# Patient Record
Sex: Female | Born: 1975
Health system: Southern US, Community
[De-identification: ages and names within clinical notes are randomized; demographics above are authoritative.]

## PROBLEM LIST (undated history)

## (undated) HISTORY — PX: FOOT SURGERY: SHX648

## (undated) HISTORY — PX: TUBAL LIGATION: SHX77

---

## 2003-09-23 ENCOUNTER — Emergency Department (HOSPITAL_COMMUNITY): Admission: EM | Admit: 2003-09-23 | Discharge: 2003-09-23 | Payer: Self-pay | Admitting: Emergency Medicine

## 2005-05-17 ENCOUNTER — Other Ambulatory Visit: Admission: RE | Admit: 2005-05-17 | Discharge: 2005-05-17 | Payer: Self-pay | Admitting: Obstetrics and Gynecology

## 2005-06-04 ENCOUNTER — Inpatient Hospital Stay (HOSPITAL_COMMUNITY): Admission: AD | Admit: 2005-06-04 | Discharge: 2005-06-04 | Payer: Self-pay | Admitting: Obstetrics and Gynecology

## 2006-05-21 ENCOUNTER — Emergency Department (HOSPITAL_COMMUNITY): Admission: EM | Admit: 2006-05-21 | Discharge: 2006-05-21 | Payer: Self-pay | Admitting: Emergency Medicine

## 2006-05-24 ENCOUNTER — Ambulatory Visit (HOSPITAL_COMMUNITY): Admission: RE | Admit: 2006-05-24 | Discharge: 2006-05-24 | Payer: Self-pay | Admitting: Obstetrics & Gynecology

## 2006-05-30 ENCOUNTER — Ambulatory Visit (HOSPITAL_COMMUNITY): Admission: RE | Admit: 2006-05-30 | Discharge: 2006-05-30 | Payer: Self-pay | Admitting: Obstetrics & Gynecology

## 2006-06-02 ENCOUNTER — Ambulatory Visit (HOSPITAL_COMMUNITY): Admission: RE | Admit: 2006-06-02 | Discharge: 2006-06-02 | Payer: Self-pay | Admitting: Obstetrics & Gynecology

## 2006-06-02 ENCOUNTER — Encounter (INDEPENDENT_AMBULATORY_CARE_PROVIDER_SITE_OTHER): Payer: Self-pay | Admitting: Specialist

## 2008-01-08 ENCOUNTER — Ambulatory Visit (HOSPITAL_BASED_OUTPATIENT_CLINIC_OR_DEPARTMENT_OTHER): Admission: RE | Admit: 2008-01-08 | Discharge: 2008-01-08 | Payer: Self-pay | Admitting: Obstetrics and Gynecology

## 2008-03-02 ENCOUNTER — Emergency Department (HOSPITAL_COMMUNITY): Admission: EM | Admit: 2008-03-02 | Discharge: 2008-03-02 | Payer: Self-pay | Admitting: Emergency Medicine

## 2008-11-04 ENCOUNTER — Emergency Department (HOSPITAL_COMMUNITY): Admission: EM | Admit: 2008-11-04 | Discharge: 2008-11-04 | Payer: Self-pay | Admitting: Emergency Medicine

## 2009-07-10 ENCOUNTER — Emergency Department (HOSPITAL_COMMUNITY): Admission: EM | Admit: 2009-07-10 | Discharge: 2009-07-10 | Payer: Self-pay | Admitting: Family Medicine

## 2009-07-25 ENCOUNTER — Emergency Department (HOSPITAL_COMMUNITY): Admission: EM | Admit: 2009-07-25 | Discharge: 2009-07-25 | Payer: Self-pay | Admitting: Family Medicine

## 2010-05-05 LAB — BASIC METABOLIC PANEL
CO2: 24 mEq/L (ref 19–32)
GFR calc Af Amer: >60 mL/min (ref >60)
GFR calc non Af Amer: >60 mL/min (ref >60)

## 2010-05-05 LAB — D-DIMER, QUANTITATIVE: D-Dimer, Quant: <0.22 ug/mL-FEU (ref 0.00–0.48)

## 2010-05-05 LAB — CBC
HCT: 37.6 % (ref 36.0–46.0)
RBC: 4.99 MIL/uL (ref 3.87–5.11)

## 2010-05-05 LAB — CK TOTAL AND CKMB (NOT AT ARMC)
CK, MB: 0.7 ng/mL (ref 0.3–4.0)
Total CK: 96 U/L (ref 7–177)

## 2010-06-02 NOTE — Op Note (Signed)
NAME:  Kimberly Kerr, Kimberly Kerr NO.:  [id number]   MEDICAL RECORD NO.:  [id number]          PATIENT TYPE:  AMB   LOCATION:  NESC                         FACILITY:  [facility]   PHYSICIAN:  Pieter Partridge, MD   DATE OF BIRTH:  06-13-1975   DATE OF PROCEDURE:  01/08/2008  DATE OF DISCHARGE:                               OPERATIVE REPORT   PREOPERATIVE DIAGNOSIS:  Undesired fertility.   POSTOPERATIVE DIAGNOSIS:  Undesired fertility.   PROCEDURE:  Laparoscopic bilateral tubal ligation.   SURGEON:  Geryl Rankins, M.D.   ASSISTANT:  Surgical technician.   ANESTHESIA:  General.   FINDINGS:  Retroverted uterus, normal tubes and ovaries bilaterally.   SPECIMENS:  None.   ESTIMATED BLOOD LOSS:  Minimal.   URINE OUTPUT:  Voided prior to procedure.   INTRAVENOUS FLUIDS:  1 liter.   COMPLICATIONS:  None.   DISPOSITION:  To PACU to stable.   PROCEDURE IN DETAIL:  [name] was identified in the holding area. She  was then taken to the operating room with IV fluids running. She voided  prior to procedure. She was placed in the supine position and underwent  general endotracheal anesthesia without complication.   The patient was then prepped and draped in a normal sterile fashion and  placed in the dorsal lithotomy position. An operative speculum was  placed in the vagina. Prior to prepping, I performed a bimanual exam and  confirmed the retroverted uterus. The single-tooth tenaculum was placed  on the posterior lip of the cervix, and the Hulka manipulator was  advanced through the uterine cervix pointed posteriorly, and the single-  tooth tenaculum was removed.   Attention was turned to the abdomen. A small infra-umbilical incision  vertically was made at the midline with a scalpel. The 5-mm trocar was  advanced easily through this port site. The patient was then placed in  Trendelenburg, and a second port in the suprapubic midline was placed 5  mm under direct  visualization with a laparoscopic.   A blunt probe manipulator was then used to explore the uterus and the  tubes, ovaries, and cul-de-sac of which there were no obvious  abnormalities. The Enseal device was then advanced through the  suprapubic port, and the fallopian tubes were coagulated and then  transected in the usual fashion. The same was done on the opposite side.   The instrument was then removed, and trocar was removed under direct  visualization. The CO2 gas was turned off, and all of the gas escaped  the abdomen via compression.   The incisions were then reapproximated with 4-0 Monocryl in the  subcutaneous space, and Dermabond was applied to the incision after they  were cleaned and dried.   The Hulka manipulator was then removed from the uterus, and there was  active bleeding noted with pressure. With silver nitrate and Monsel's,  the site was hemostatic.   All instrument, sponge, and needle counts were correct x3. The patient  was taken to the PACU in stable condition.      Pieter Partridge, MD  Electronically Signed  EBV/MEDQ  D:  01/08/2008  T:  01/08/2008  Job:  [id number]

## 2010-06-02 NOTE — H&P (Signed)
NAME:  Kimberly Kerr, Kimberly Kerr NO.:  [id number]   MEDICAL RECORD NO.:  [id number]          PATIENT TYPE:  AMB   LOCATION:  SDC                           FACILITY:  [facility]   PHYSICIAN:  Roseanna Rainbow, M.D.DATE OF BIRTH:  [birth year]   DATE OF ADMISSION:  DATE OF DISCHARGE:                              HISTORY & PHYSICAL   CHIEF COMPLAINT:  Patient is a 35 year old with an embryonic demise who  presents for suction D&C.   HISTORY OF PRESENT ILLNESS:  The patient has had serial ultrasounds to  document viability.  The most recent ultrasound failed to demonstrate  any interval growth of the gestational sac from the previous study.  No  fetal pole was noted.  These findings were felt to be consistent with an  anembryonic pregnancy.   PAST GYN HISTORY:  Noncontributory.   PAST MEDICAL HISTORY:  No significant history of medical diseases.   PAST SURGICAL HISTORY:  She denies.   SOCIAL HISTORY:  She is employed as a Diplomatic Services operational officer.  She is married, living  with her spouse.  Does not give any significant history of alcohol  usage.  Has no significant smoking history.  Denies illicit drug use.   FAMILY HISTORY:  Adult-onset diabetes and hypertension.   ALLERGIES:  No known drug allergies.   PAST OB HISTORY:  She has had three spontaneous vaginal deliveries at  term, uncomplicated.   PHYSICAL EXAMINATION:  VITAL SIGNS:  Blood pressure 122/82.  GENERAL:  Well-developed and well-nourished in no apparent distress.  LUNGS:  Clear to auscultation bilaterally.  HEART:  Regular rate and rhythm.  ABDOMEN:  Soft and nontender.  PELVIC:  Normal external female genitalia.  On speculum exam, the vagina  is clean.  The cervix is closed and without lesions.  On bimanual exam,  the uterus is upper limits of normal in size, nontender.  EXTREMITIES:  No clubbing, cyanosis or edema.   ASSESSMENT:  Embryonic demise.   PLAN:  The planned procedure is a suction dilatation and curettage.   The  risks, benefits and alternative forms of management were reviewed with  the patient, and informed consent had been obtained.  Will check an ABO  and Rh and administer RhoGAM as needed.      Roseanna Rainbow, M.D.  Electronically Signed     LAJ/MEDQ  D:  06/01/2006  T:  06/01/2006  Job:  [id number]

## 2010-06-02 NOTE — Op Note (Signed)
NAME:  Kimberly Kerr, Kimberly Kerr NO.:  [id number]   MEDICAL RECORD NO.:  [id number]          PATIENT TYPE:  AMB   LOCATION:  SDC                           FACILITY:  [facility]   PHYSICIAN:  Roseanna Rainbow, M.D.DATE OF BIRTH:  1975/04/20   DATE OF PROCEDURE:  06/02/2006  DATE OF DISCHARGE:                               OPERATIVE REPORT   PREOPERATIVE DIAGNOSIS:  Embryonic demise.   POSTOPERATIVE DIAGNOSIS:  Embryonic demise.   PROCEDURE:  Suction dilatation and curettage.   SURGEON:  Dr. Tamela Oddi   ANESTHESIA:  Managed anesthesia care, paracervical block.   PATHOLOGY:  Products of conception.   ESTIMATED BLOOD LOSS:  Minimal.   COMPLICATIONS:  None.   PROCEDURE:  The patient is taken to the operating room with an IV  running.  The patient placed in the dorsal lithotomy position and  prepped and draped in the usual sterile fashion.  A Graves speculum was  then placed into the vagina.  The anterior lip of the cervix was then  infiltrated with 2 mL of 1% lidocaine.  The single-tooth tenaculum was  then applied to this location.  4 mL of 1% lidocaine were then  infiltrated at 4 and 7 o'clock to produce a paracervical block.  The  cervix was then dilated with [facility] dilators.  A 7-mm suction curette was  then advanced through the cervix and into the uterine cavity.  The  curette was activated and rotated to evacuate the uterus of the products  of conception.  Sharp curettage was performed and a gritty texture was  noted.  A final pass was then made with the suction curette.  The single-  tooth tenaculum was removed with minimal bleeding noted from the cervix.  At the close of the procedure the instrument and pack counts were said  to be correct x2.  The patient was taken to the PACU awake and in stable  condition.      Roseanna Rainbow, M.D.  Electronically Signed     LAJ/MEDQ  D:  06/02/2006  T:  06/02/2006  Job:  [id number]

## 2010-10-31 ENCOUNTER — Inpatient Hospital Stay (INDEPENDENT_AMBULATORY_CARE_PROVIDER_SITE_OTHER)
Admission: RE | Admit: 2010-10-31 | Discharge: 2010-10-31 | Disposition: A | Discharge status: Home / Self Care | Payer: 59 | Source: Ambulatory Visit | Attending: Emergency Medicine | Admitting: Emergency Medicine

## 2010-10-31 DIAGNOSIS — R197 Diarrhea, unspecified: Secondary | ICD-10-CM

## 2010-11-03 ENCOUNTER — Emergency Department (HOSPITAL_COMMUNITY)
Admission: EM | Admit: 2010-11-03 | Discharge: 2010-11-03 | Disposition: A | Discharge status: Home / Self Care | Payer: 59 | Attending: Emergency Medicine | Admitting: Emergency Medicine

## 2010-11-03 DIAGNOSIS — R197 Diarrhea, unspecified: Secondary | ICD-10-CM | POA: Insufficient documentation

## 2010-11-03 LAB — URINALYSIS, ROUTINE W REFLEX MICROSCOPIC
Glucose, UA: NEGATIVE mg/dL
Ketones, ur: >80 mg/dL — AB
Leukocytes, UA: NEGATIVE
Nitrite: NEGATIVE
Specific Gravity, Urine: 1.03 (ref 1.005–1.030)
Urobilinogen, UA: 0.2 mg/dL (ref 0.0–1.0)
pH: 6 (ref 5.0–8.0)

## 2010-11-03 LAB — URINE MICROSCOPIC-ADD ON

## 2010-11-03 LAB — CBC
MCH: 23.1 pg — ABNORMAL LOW (ref 26.0–34.0)
MCHC: 32.2 g/dL (ref 30.0–36.0)
Platelets: 261 10*3/uL (ref 150–400)
RBC: 5.45 MIL/uL — ABNORMAL HIGH (ref 3.87–5.11)
WBC: 7.9 10*3/uL (ref 4.0–10.5)

## 2010-11-03 LAB — POCT I-STAT, CHEM 8
BUN: 8 mg/dL (ref 6–23)
Calcium, Ion: 1.19 mmol/L (ref 1.12–1.32)
Chloride: 104 mEq/L (ref 96–112)
Glucose, Bld: 81 mg/dL (ref 70–99)
Hemoglobin: 14.6 g/dL (ref 12.0–15.0)
TCO2: 24 mmol/L (ref 0–100)

## 2010-11-03 LAB — DIFFERENTIAL
Basophils Relative: 0 % (ref 0–1)
Eosinophils Absolute: 0 10*3/uL (ref 0.0–0.7)
Eosinophils Relative: 0 % (ref 0–5)
Lymphs Abs: 1.1 10*3/uL (ref 0.7–4.0)

## 2010-11-04 ENCOUNTER — Inpatient Hospital Stay (HOSPITAL_COMMUNITY)
Admission: EM | Admit: 2010-11-04 | Discharge: 2010-11-08 | DRG: 373 | Disposition: A | Discharge status: Home / Self Care | Payer: 59 | Attending: Internal Medicine | Admitting: Internal Medicine

## 2010-11-04 DIAGNOSIS — E869 Volume depletion, unspecified: Secondary | ICD-10-CM | POA: Diagnosis present

## 2010-11-04 DIAGNOSIS — D509 Iron deficiency anemia, unspecified: Secondary | ICD-10-CM | POA: Diagnosis present

## 2010-11-04 DIAGNOSIS — E876 Hypokalemia: Secondary | ICD-10-CM | POA: Diagnosis present

## 2010-11-04 DIAGNOSIS — A0472 Enterocolitis due to Clostridium difficile, not specified as recurrent: Principal | ICD-10-CM | POA: Diagnosis present

## 2010-11-05 ENCOUNTER — Encounter (HOSPITAL_COMMUNITY): Payer: Self-pay

## 2010-11-05 ENCOUNTER — Emergency Department (HOSPITAL_COMMUNITY): Payer: 59

## 2010-11-05 ENCOUNTER — Other Ambulatory Visit: Payer: Self-pay | Admitting: Gastroenterology

## 2010-11-05 LAB — BASIC METABOLIC PANEL
BUN: 4 mg/dL — ABNORMAL LOW (ref 6–23)
Calcium: 9.4 mg/dL (ref 8.4–10.5)
Chloride: 100 mEq/L (ref 96–112)
GFR calc Af Amer: >90 mL/min (ref >90)
GFR calc non Af Amer: >90 mL/min (ref >90)
Potassium: 3.3 mEq/L — ABNORMAL LOW (ref 3.5–5.1)

## 2010-11-05 LAB — CBC
MCHC: 33.2 g/dL (ref 30.0–36.0)
Platelets: 232 10*3/uL (ref 150–400)
RBC: 4.86 MIL/uL (ref 3.87–5.11)

## 2010-11-05 LAB — URINE MICROSCOPIC-ADD ON

## 2010-11-05 LAB — URINALYSIS, ROUTINE W REFLEX MICROSCOPIC: Glucose, UA: NEGATIVE mg/dL

## 2010-11-05 MED ORDER — IOHEXOL 300 MG/ML  SOLN
100.0000 mL | Freq: Once | INTRAMUSCULAR | Status: AC | PRN
  Administered 2010-11-05: 100 mL via INTRAVENOUS

## 2010-11-06 LAB — BASIC METABOLIC PANEL
CO2: 22 mEq/L (ref 19–32)
Calcium: 8.3 mg/dL — ABNORMAL LOW (ref 8.4–10.5)
Chloride: 107 mEq/L (ref 96–112)
Sodium: 136 mEq/L (ref 135–145)

## 2010-11-06 LAB — URINE CULTURE: Culture  Setup Time: 201210190108

## 2010-11-06 LAB — CBC
Platelets: 224 10*3/uL (ref 150–400)
RBC: 4.57 MIL/uL (ref 3.87–5.11)
WBC: 5 10*3/uL (ref 4.0–10.5)

## 2010-11-06 LAB — FERRITIN: Ferritin: 86 ng/mL (ref 10–291)

## 2010-11-06 LAB — CLOSTRIDIUM DIFFICILE BY PCR: Toxigenic C. Difficile by PCR: POSITIVE — AB

## 2010-11-08 LAB — CBC
HCT: 31.4 % — ABNORMAL LOW (ref 36.0–46.0)
Hemoglobin: 10.5 g/dL — ABNORMAL LOW (ref 12.0–15.0)
MCV: 69.2 fL — ABNORMAL LOW (ref 78.0–100.0)
RDW: 13.3 % (ref 11.5–15.5)
WBC: 5.1 10*3/uL (ref 4.0–10.5)

## 2010-11-08 LAB — BASIC METABOLIC PANEL
BUN: <3 mg/dL — ABNORMAL LOW (ref 6–23)
Chloride: 105 mEq/L (ref 96–112)
Creatinine, Ser: 0.74 mg/dL (ref 0.50–1.10)
GFR calc Af Amer: >90 mL/min (ref >90)
Glucose, Bld: 83 mg/dL (ref 70–99)

## 2010-11-12 NOTE — Consult Note (Signed)
  NAME:  Kimberly Kerr, Kimberly Kerr NO.:  [id number]  MEDICAL RECORD NO.:  [id number]  LOCATION:  1529                         FACILITY:  [facility]  PHYSICIAN:  Bernette Redbird, M.D.   DATE OF BIRTH:  [birth year]  DATE OF CONSULTATION:  11/05/2010 DATE OF DISCHARGE:                                CONSULTATION   Dr. 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  The patient has been in good general health in the past and in particular has no past history of any GI problems.  Recently, she was given antibiotics for a vaginal discharge through various urgent Care or walk-in clinic, and thereafter, developed profuse diarrhea, which has been going on for 2 weeks and has culminated in as many as 30 watery, but nonbloody bowel movements per day.  She presented to the emergency room today when she was unable to get a new patient appointment promptly in our office.  In the emergency room, CT scan showed diffuse colitis, and we were thus asked to see the patient.  PAST MEDICAL HISTORY:  ALLERGIES:  No known medication allergies.  OUTPATIENT MEDICATIONS:  None.  CHRONIC MEDICAL ILLNESSES:  None.  SURGERY OR OPERATIONS:  Prior surgery on her foot, also tubal ligation.  HABITS:  Nonsmoker, nondrinker.  FAMILY HISTORY:  Colon cancer in her mother, newly diagnosed, at age 31.  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  PHYSICAL EXAMINATION:  GENERAL:  A pleasant, healthy-appearing African American female in no acute distress.  Anicteric. CHEST:  Clear. HEART:  Unremarkable. ABDOMEN:  Slightly tender, perhaps minimally distended, no mass effect.  LABORATORY DATA:  White count 7100, hemoglobin 11.4, which reflex a 3 g drop from 2 days ago, presumably as a consequent of IV hydration.  MCV 71, platelets 232,000.  Chemistry  panel shows BUN 4, creatinine 0.74, potassium 3.3.  Urinalysis, negative.  Fecal occult blood positive.  CT:  Diffuse colitis, as noted above.  IMPRESSION: 1. Severe diarrhea. 2. Heme-positive stool, with mild microcytic anemia. 3. Family history of colon cancer.  PLAN:  Sigmoidoscopic evaluation this afternoon, the purpose and risks of which were reviewed and the patient is agreeable.  Further management to depend on those findings.          ______________________________ Bernette Redbird, M.D.     RB/MEDQ  D:  11/05/2010  T:  11/05/2010  Job:  [id number]  Electronically Signed by Bernette Redbird M.D. on 11/12/2010 02:49:26 PM

## 2010-11-15 NOTE — H&P (Signed)
NAME:  Kimberly Kerr, MOCARSKI NO.:  [id number]  MEDICAL RECORD NO.:  [id number]  LOCATION:  WLED                         FACILITY:  [facility]  PHYSICIAN:  Kela Millin, M.D.DATE OF BIRTH:  [birth year]  DATE OF ADMISSION:  11/04/2010 DATE OF DISCHARGE:                             HISTORY & PHYSICAL   PRIMARY CARE PHYSICIAN:  Unassigned.  CHIEF COMPLAINT:  Worsening diarrhea and nausea.  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 She states that 2 weeks ago, she went to [facility] because she had a vaginal discharge and she was started on ? Cipro and Flagyl. After she started taking them, she began having diarrhea and so decided to stop the antibiotics for 3-4 days later.  Even after she stopped the antibiotics, she continued to have the diarrhea and so she went to the [facility] and she was started on a line and ? lactose, this did not help with her diarrhea and so 2 days ago, she came to the [facility] ED and was started on diphenoxylate/atropine and Levsin as she was also having some abdominal cramping, but she did not get any improvement of her diarrhea or the cramping with this.  She was referred to see Dr. Dewayne Shorter with [facility] following that [facility] ED visit but when she called, the appointment was scheduled for November 11, but because the diarrhea was continuing to worsen - up to about 30 stools a day, with mucus and blood streaks.  She admits to nausea, but no vomiting.  She states she has been having some abdominal pain described as cramping, diffuse that worsens to about a 10/10 when she is about to have a bowel movement and she gets some relief following the bowel movement.  She denies fevers.  She denies dysuria.  She also denies chest pain, no shortness of breath.  In  the ED, she had a CT scan of the abdomen and pelvis done, which showed diffuse colonic wall thickening and edema consistent with nonspecific colitis - infectious versus inflammatory etiologies.  Her chemistries revealed electrolyte imbalances and she is admitted for further evaluation and management.  PAST MEDICAL HISTORY:  None, as above.  MEDICATIONS:  Levsin and diphenoxylate/atropine, which was prescribed at the [facility] ED 2 days ago.  ALLERGIES:  NKDA.  SOCIAL HISTORY:  She denies tobacco.  She denies alcohol.  FAMILY HISTORY:  Her mother was just diagnosed with colon cancer at age 85.  REVIEW OF SYSTEMS:  As per HPI, other review of systems negative.  PHYSICAL EXAMINATION:  GENERAL:  The patient is a pleasant young black thin appearing female in no apparent distress. VITAL SIGNS:  Temperature is 98.4 with a blood pressure of 127/70 (low of 98/84 in the ED), pulse is 98, initially 103, her respiratory rate is 16, O2 sat is 98%. HEENT: PERRL, EOMI, dry mucous membranes.  No oral exudates.  Sclerae anicteric.NECK:  Supple, no adenopathy, no thyromegaly.  No JVD. LUNGS:  Clear to auscultation bilaterally.  No crackles or wheezes. CARDIOVASCULAR: Regular rate and rhythm.  Normal S1, S2. ABDOMEN:  Soft, bowel sounds present.  Diffuse tenderness, but greater in the left lower quadrant.  No rebound tenderness. EXTREMITIES: No cyanosis and no edema. NEURO:  She is alert and oriented x3.  Cranial nerves II-XII grossly intact.  Nonfocal exam.  LABORATORY DATA:  CT scan as per HPI.  Also, her sodium is 135 with a potassium of 3.3, chloride is 100, CO2 of 20, glucose 69, BUN of 4, creatinine 0.74, calcium 9.4.  ASSESSMENT AND PLAN: 1. Drugs colitis-infection versus inflammatory, as discussed above.     It is noted that her symptoms started after she was started on     antibiotics for a vaginal discharge, will obtain the exact     antibiotics as she is not for sure.  We will  obtain stool for C     diff and cultures.  We will start on IV Cipro and oral Flagyl and     follow.  We will consult GI, [facility] as patient had called for an     appointment there. 2. Hypokalemia-replace potassium. 3. Volume depletion-hydrate follow and recheck.     Kela Millin, M.D.     ACV/MEDQ  D:  11/05/2010  T:  11/05/2010  Job:  [id number]  Electronically Signed by Donnalee Curry M.D. on 11/15/2010 03:23:56 PM

## 2010-11-15 NOTE — Discharge Summary (Signed)
Kimberly Kerr, BELLIN NO.:  [id number]  MEDICAL RECORD NO.:  [id number]  LOCATION:  1529                         FACILITY:  [facility]  PHYSICIAN:  Kela Millin, M.D.DATE OF BIRTH:  12-15-1975  DATE OF ADMISSION:  11/04/2010 DATE OF DISCHARGE:  11/08/2010                        DISCHARGE SUMMARY - REFERRING   DISCHARGE DIAGNOSES: 1. Clostridium difficile/pseudomembranous colitis. 2. Hypokalemia - potassium repleted. 3. Volume depletion - resolved.  CONSULTATION:  Gastroenterology - Bernette Redbird, MD  PROCEDURES AND STUDIES: 1. CT scan of the abdomen on November 05, 2010 - diffuse colonic wall     thickening and edema consistent with nonspecific colitis. 2. Flexible sigmoidoscopy done on November 05, 2010, per Dr. Matthias Hughs -     pseudomembranous colitis.  Biopsies done - findings consistent with     pseudomembranous colitis.  HISTORY:  The patient is a pleasant 35 year old black female with the above-listed medical problems, who presented with worsening diarrhea and nausea over a 2-week period following antibiotics that she had been prescribed at an Urgent Care for a ?vaginal infection.  Please see the full admission history and physical dictated on November 05, 2010, for the details of the admission history, physical exam, and laboratory data.  HOSPITAL COURSE: 1. Clostridium difficile/pseudomembranous colitis - upon admission,     the patient had a CT scan of her abdomen and pelvis which showed     findings consistent with colitis and she was started on empiric     antibiotics with Cipro and oral Flagyl.  Stool studies were ordered     and Gastroenterology was consulted and Dr. Matthias Hughs saw the patient     and it was noted that her stools were heme-positive and she also     had a microcytic anemia and so a flexible sigmoidoscopy was done     and it came back with findings consistent with pseudomembranous     colitis.  Following that, the Cipro was  discontinued and she was     maintained on oral Flagyl and Florastor was added.  Upon follow up,     the patient was still having multiple stools - greater than 15, so     oral vancomycin was added.  Following that, the patient responded     well with a marked decrease in her diarrhea and the nausea,     vomiting, and abdominal pain resolved.  The patient is tolerating     p.o. as well at this time.  So far today, has had 1 stool that is     more formed and she has remained afebrile and hemodynamically     stable with no leukocytosis.  Her last white cell count today is     5.9.  I discussed the patient with Dr. Madilyn Fireman on call for [facility]     and he agrees with discharging the patient and recommends oral     vancomycin for 10 days.  She is to continue Florastor for 2 weeks     after she completes the antibiotics.  The patient is to follow up     outpatient with Dr. Matthias Hughs.  Case management is also to assist  with PCP and she is to follow up with primary care physician     outpatient. 2. Hypokalemia - her potassium was replaced during this hospital stay. 3. Volume depletion.  She was hydrated with IV fluids and it resolved. 4. Microcytic anemia - the patient had a flexible sigmoidoscopy and it     showed findings consistent with pseudomembranous colitis.  The     patient is to follow up with Dr. Matthias Hughs outpatient, it was noted     that her mother had colon cancer.  She had a ferritin done and it     came back at 86.  Her hemoglobin today upon discharge is 10.5.  DISCHARGE MEDICATIONS: 1. Florastor 500 mg p.o. b.i.d. and she is to continue this for 2     weeks after she completes her antibiotics. 2. Vancomycin 125 mg p.o. q.6 hours for 10 days. 3. Tylenol Extra Strength 2 tablets q.4 hours p.r.n. Discontinue medications - Lomotil, Levsin.  FOLLOWUP CARE: 1. Dr. Matthias Hughs in 1 to 2 weeks, call for appointment. 2. Primary care physician in 1 to 2 weeks, call for  appointment.  DISCHARGE CONDITION:  Improved/stable.     Kela Millin, M.D.     ACV/MEDQ  D:  11/08/2010  T:  11/08/2010  Job:  [id number]  cc:   Bernette Redbird, M.D. Fax: [phone]  Electronically Signed by Donnalee Curry M.D. on 11/15/2010 03:23:45 PM

## 2011-03-20 ENCOUNTER — Encounter (HOSPITAL_COMMUNITY): Payer: Self-pay | Admitting: Emergency Medicine

## 2011-03-20 ENCOUNTER — Emergency Department (HOSPITAL_COMMUNITY)
Admission: EM | Admit: 2011-03-20 | Discharge: 2011-03-21 | Disposition: A | Discharge status: Home / Self Care | Payer: 59 | Attending: Emergency Medicine | Admitting: Emergency Medicine

## 2011-03-20 DIAGNOSIS — M545 Low back pain, unspecified: Secondary | ICD-10-CM | POA: Insufficient documentation

## 2011-03-20 DIAGNOSIS — Z331 Pregnant state, incidental: Secondary | ICD-10-CM | POA: Insufficient documentation

## 2011-03-20 DIAGNOSIS — Z9889 Other specified postprocedural states: Secondary | ICD-10-CM | POA: Insufficient documentation

## 2011-03-20 DIAGNOSIS — M549 Dorsalgia, unspecified: Secondary | ICD-10-CM | POA: Insufficient documentation

## 2011-03-20 DIAGNOSIS — O219 Vomiting of pregnancy, unspecified: Secondary | ICD-10-CM | POA: Insufficient documentation

## 2011-03-20 DIAGNOSIS — R110 Nausea: Secondary | ICD-10-CM

## 2011-03-20 LAB — CBC
HCT: 32.5 % — ABNORMAL LOW (ref 36.0–46.0)
Hemoglobin: 11.1 g/dL — ABNORMAL LOW (ref 12.0–15.0)
MCV: 70 fL — ABNORMAL LOW (ref 78.0–100.0)
RBC: 4.64 MIL/uL (ref 3.87–5.11)
WBC: 8.5 10*3/uL (ref 4.0–10.5)

## 2011-03-20 LAB — DIFFERENTIAL
Basophils Relative: 0 % (ref 0–1)
Eosinophils Relative: 1 % (ref 0–5)
Lymphocytes Relative: 20 % (ref 12–46)
Lymphs Abs: 1.7 10*3/uL (ref 0.7–4.0)
Monocytes Relative: 8 % (ref 3–12)
Neutro Abs: 6 10*3/uL (ref 1.7–7.7)

## 2011-03-20 LAB — URINALYSIS, ROUTINE W REFLEX MICROSCOPIC
Glucose, UA: NEGATIVE mg/dL
Hgb urine dipstick: NEGATIVE
Leukocytes, UA: NEGATIVE
Protein, ur: NEGATIVE mg/dL
Specific Gravity, Urine: 1.035 — ABNORMAL HIGH (ref 1.005–1.030)
pH: 6.5 (ref 5.0–8.0)

## 2011-03-20 LAB — COMPREHENSIVE METABOLIC PANEL
ALT: 8 U/L (ref 0–35)
AST: 11 U/L (ref 0–37)
Albumin: 4 g/dL (ref 3.5–5.2)
CO2: 23 mEq/L (ref 19–32)
Calcium: 9.2 mg/dL (ref 8.4–10.5)
Chloride: 103 mEq/L (ref 96–112)
GFR calc non Af Amer: >90 mL/min (ref >90)
Sodium: 135 mEq/L (ref 135–145)
Total Bilirubin: 0.3 mg/dL (ref 0.3–1.2)

## 2011-03-20 LAB — TYPE AND SCREEN: Antibody Screen: NEGATIVE

## 2011-03-20 LAB — WET PREP, GENITAL
Clue Cells Wet Prep HPF POC: NONE SEEN
Trich, Wet Prep: NONE SEEN

## 2011-03-20 MED ORDER — MORPHINE SULFATE 4 MG/ML IJ SOLN: 4.0000 mg | Freq: Once | INTRAMUSCULAR | Status: DC

## 2011-03-20 MED ORDER — ONDANSETRON HCL 4 MG/2ML IJ SOLN: 4.0000 mg | Freq: Once | INTRAMUSCULAR | Status: DC

## 2011-03-20 MED ORDER — SODIUM CHLORIDE 0.9 % IV BOLUS (SEPSIS)
1000.0000 mL | Freq: Once | INTRAVENOUS | Status: AC
  Administered 2011-03-20: 1000 mL via INTRAVENOUS

## 2011-03-20 NOTE — ED Provider Notes (Signed)
History     CSN: [id number]  Arrival date & time 03/20/11  2038   First MD Initiated Contact with Patient 03/20/11 2109      Chief Complaint  Patient presents with  . Back Pain  . positive urine pregnancy test     (Consider location/radiation/quality/duration/timing/severity/associated sxs/prior treatment) HPI Comments: Patient presents with one month of left lower back pain has gotten worse and persisted to the point today where she feels that she's going to pass out from pain. Gradually onset month ago and steadily gotten worse. She denies any lifting or trauma to the back. No weakness, numbness, tingling. The pain does radiate down her left leg occasionally. 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 She had a positive pregnancy test despite tubal ligation 3 years ago. Denies abdominal pain, nausea, vomiting, vaginal bleeding or discharge. No dysuria, hematuria, fever chills. She is on no weakness, numbness, tingling, bowel bladder incontinence, no history of previous back pain.  Her LMP was the end of January.  The history is provided by the patient.    History reviewed. No pertinent past medical history.  Past Surgical History  Procedure Date  . Tubal ligation     History reviewed. No pertinent family history.  History  Substance Use Topics  . Smoking status: Not on file  . Smokeless tobacco: Not on file  . Alcohol Use: No    OB History    Grav Para Term Preterm Abortions TAB SAB Ect Mult Living                  Review of Systems  Constitutional: Negative for fever, activity change and appetite change.  HENT: Negative for congestion and rhinorrhea.   Respiratory: Negative for cough and shortness of breath.   Cardiovascular: Negative for chest pain.  Gastrointestinal: Negative for nausea, vomiting and abdominal pain.  Genitourinary: Negative for dysuria, hematuria, vaginal bleeding and vaginal discharge.  Musculoskeletal:  Positive for back pain.  Skin: Negative for rash.  Neurological: Negative for dizziness, weakness and headaches.    Allergies  Review of patient's allergies indicates no known allergies.  Home Medications  No current outpatient prescriptions on file.  BP 138/99  Pulse 109  Temp(Src) 97.7 F (36.5 C) (Oral)  Resp 16  Ht 5\' 7"  (1.702 m)  Wt 130 lb (58.968 kg)  BMI 20.36 kg/m2  SpO2 100%  LMP 03/15/2011  Physical Exam  Constitutional: She is oriented to person, place, and time. She appears well-developed and well-nourished. She appears distressed.       Uncomfortable  HENT:  Head: Normocephalic and atraumatic.  Mouth/Throat: Oropharynx is clear and moist. No oropharyngeal exudate.  Eyes: Conjunctivae are normal. Pupils are equal, round, and reactive to light.  Neck: Normal range of motion. Neck supple.  Cardiovascular: Normal rate, regular rhythm and normal heart sounds.   No murmur heard. Pulmonary/Chest: Effort normal and breath sounds normal. No respiratory distress. She has no rales.  Abdominal: Soft. There is no tenderness. There is no rebound and no guarding.  Genitourinary: There is no tenderness on the right labia. There is no tenderness on the left labia. Cervix exhibits no motion tenderness and no discharge. Right adnexum displays tenderness. Left adnexum displays tenderness. Left adnexum displays no mass.  Musculoskeletal: She exhibits tenderness.       Left paraspinal lumbar pain  Neurological: She is alert and oriented to person, place, and time. No cranial nerve deficit.  5 Out of 5 strength bilateral lower extremities, +2 patellar reflexes bilaterally, great toe dorsiflexion intact bilaterally, +2 DP and PT pulses bilaterally  Skin: Skin is warm.    ED Course  Procedures (including critical care time)  Labs Reviewed  HCG, QUANTITATIVE, PREGNANCY - Abnormal; Notable for the following:    hCG, Beta Chain, Quant, Kathie Rhodes [id number] (*)    All other components within  normal limits  PREGNANCY, URINE - Abnormal; Notable for the following:    Preg Test, Ur POSITIVE (*)    All other components within normal limits  URINALYSIS, ROUTINE W REFLEX MICROSCOPIC - Abnormal; Notable for the following:    Specific Gravity, Urine 1.035 (*)    Ketones, ur 40 (*)    All other components within normal limits  CBC - Abnormal; Notable for the following:    Hemoglobin 11.1 (*)    HCT 32.5 (*)    MCV 70.0 (*)    MCH 23.9 (*)    All other components within normal limits  WET PREP, GENITAL - Abnormal; Notable for the following:    WBC, Wet Prep HPF POC FEW (*)    All other components within normal limits  DIFFERENTIAL  TYPE AND SCREEN  COMPREHENSIVE METABOLIC PANEL  GC/CHLAMYDIA PROBE AMP, GENITAL   No results found.   No diagnosis found.    MDM  Severe back pain with positive pregnancy test at home. Rule out ectopic pregnancy. No neurological deficit.  Patient's description of pain appears consistent with sciatica.  Pregnancy test positive.  Pelvic exam benign. [place] ordered and OB paged.  Discussed concern for ectopic pregnancy with Dr. Tamela Oddi.  [place] [id number], she doubts ectopic and expects patient to have IUP.   Will await [place] to confirm.  Awaiting [place] at change of shift.  Care transferred to Dr. Lynelle Doctor.     Glynn Octave, MD 03/21/11 [phone]

## 2011-03-20 NOTE — ED Notes (Signed)
MD at bedside. 

## 2011-03-20 NOTE — ED Notes (Signed)
Pt parented to the Er with c/o lower back pain, 10/10, pain started gradually and today pt states she felt like she will pass out. Pt further explains that she is not even able to tight the shoes secondary to the pain. Also pt reports that she has tubal ligation however urine test is done and positive (+) for pregnancy.

## 2011-03-21 ENCOUNTER — Emergency Department (HOSPITAL_COMMUNITY): Payer: 59

## 2011-03-21 MED ORDER — ONDANSETRON HCL 4 MG PO TABS: 4.0000 mg | ORAL_TABLET | Freq: Four times a day (QID) | ORAL | Status: AC | PRN

## 2011-03-21 MED ORDER — PRENATAL COMPLETE 14-0.4 MG PO TABS: 1.0000 | ORAL_TABLET | Freq: Every day | ORAL | Status: DC

## 2011-03-21 MED ORDER — HYDROCODONE-ACETAMINOPHEN 5-325 MG PO TABS: 2.0000 | ORAL_TABLET | Freq: Four times a day (QID) | ORAL | Status: AC | PRN

## 2011-03-21 NOTE — ED Notes (Signed)
US at bedside

## 2011-03-21 NOTE — ED Notes (Signed)
Rx given to patient. Family at bedside

## 2011-03-21 NOTE — ED Provider Notes (Signed)
She turned over to me at change of shift. Patient relates she had her tubes burned and cut in about 2010. She cannot recall the name of her gynecologist. She states she is having low back pain that sometimes radiates down her leg. She does not have pain in her buttock area were going down the back of her leg. 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 Patient relates she has been having nausea and smells of food makes her feel sick.  On exam patient has tenderness in her lower lumbar sacral region. She does not have pain over the sciatic notch.  We have discussed her ultrasound results. I have looked in her old records and found her GYN who did her surgery with Dr Dion Body, and she wants to go back to see her for her prenatal care.   US Ob Comp Less 14 Wks  03/21/2011  *RADIOLOGY REPORT*  Clinical Data: Back pain.  OBSTETRIC <14 WK [place] AND TRANSVAGINAL OB US  Technique:  Both transabdominal and transvaginal ultrasound examinations were performed for complete evaluation of the gestation as well as the maternal uterus, adnexal regions, and pelvic cul-de-sac.  Transvaginal technique was performed to assess early pregnancy.  Comparison:  Prior ultrasound of pregnancy performed 05/30/2006, and CT of the abdomen and pelvis performed 11/05/2010  Intrauterine gestational sac:  Visualized/normal in shape. Yolk sac: Yes Embryo: Yes Cardiac Activity: Yes Heart Rate: 160 bpm  CRL: 15.6   mm  8   w  0   d        [place] EDC: 10/31/2011  Maternal uterus/adnexae: No subchorionic hemorrhage is seen.  The uterus is retroverted in nature, and unremarkable in appearance.  The ovaries are within normal limits. The right ovary measures 3.2 x 3.1 x 2.0 cm, while the left ovary measures 3.1 x 1.3 x 2.7 cm. No suspicious adnexal masses are seen; there is no evidence for ovarian torsion.  No free fluid is seen within the pelvic cul-de-sac.  IMPRESSION: Single live  intrauterine pregnancy noted, with a crown-rump length of 15.6 mm, corresponding to a gestational age of [redacted] weeks 0 days. This does not match the gestational age of [redacted] weeks 2 days by LMP, and reflects a new estimated date of delivery of October 31, 2011.  Original Report Authenticated By: Tonia Ghent, M.D.   US Ob Transvaginal  03/21/2011  *RADIOLOGY REPORT*  Clinical Data: Back pain.  OBSTETRIC <14 WK [place] AND TRANSVAGINAL OB US  Technique:  Both transabdominal and transvaginal ultrasound examinations were performed for complete evaluation of the gestation as well as the maternal uterus, adnexal regions, and pelvic cul-de-sac.  Transvaginal technique was performed to assess early pregnancy.  Comparison:  Prior ultrasound of pregnancy performed 05/30/2006, and CT of the abdomen and pelvis performed 11/05/2010  Intrauterine gestational sac:  Visualized/normal in shape. Yolk sac: Yes Embryo: Yes Cardiac Activity: Yes Heart Rate: 160 bpm  CRL: 15.6   mm  8   w  0   d        [place] EDC: 10/31/2011  Maternal uterus/adnexae: No subchorionic hemorrhage is seen.  The uterus is retroverted in nature, and unremarkable in appearance.  The ovaries are within normal limits. The right ovary measures 3.2 x 3.1 x 2.0 cm, while the left ovary measures 3.1 x 1.3 x 2.7 cm. No suspicious adnexal masses are seen; there is no evidence for ovarian torsion.  No free  fluid is seen within the pelvic cul-de-sac.  IMPRESSION: Single live intrauterine pregnancy noted, with a crown-rump length of 15.6 mm, corresponding to a gestational age of [redacted] weeks 0 days. This does not match the gestational age of [redacted] weeks 2 days by LMP, and reflects a new estimated date of delivery of October 31, 2011.  Original Report Authenticated By: Tonia Ghent, M.D.    Diagnoses that have been ruled out:  None  Diagnoses that are still under consideration:  None  Final diagnoses:  Pregnancy as incidental finding  Back pain  Nausea   New Prescriptions    HYDROCODONE-ACETAMINOPHEN (NORCO) 5-325 MG PER TABLET    Take 2 tablets by mouth every 6 (six) hours as needed for pain.   ONDANSETRON (ZOFRAN) 4 MG TABLET    Take 1 tablet (4 mg total) by mouth every 6 (six) hours as needed for nausea.   PRENATAL VIT-FE FUMARATE-FA (PRENATAL COMPLETE) 14-0.4 MG TABS    Take 1 tablet by mouth daily.   Plan discharge Devoria Albe, MD, Franz Dell, MD 03/21/11 [phone]

## 2011-03-22 LAB — GC/CHLAMYDIA PROBE AMP, GENITAL
Chlamydia, DNA Probe: NEGATIVE
GC Probe Amp, Genital: NEGATIVE

## 2011-05-05 ENCOUNTER — Other Ambulatory Visit: Payer: Self-pay | Admitting: Obstetrics and Gynecology

## 2011-05-05 ENCOUNTER — Other Ambulatory Visit (HOSPITAL_COMMUNITY)
Admission: RE | Admit: 2011-05-05 | Discharge: 2011-05-05 | Disposition: A | Discharge status: Home / Self Care | Payer: 59 | Source: Ambulatory Visit | Attending: Obstetrics and Gynecology | Admitting: Obstetrics and Gynecology

## 2011-05-05 DIAGNOSIS — Z01419 Encounter for gynecological examination (general) (routine) without abnormal findings: Secondary | ICD-10-CM | POA: Insufficient documentation

## 2011-09-14 ENCOUNTER — Encounter (HOSPITAL_COMMUNITY): Payer: Self-pay | Admitting: *Deleted

## 2011-09-14 DIAGNOSIS — R9431 Abnormal electrocardiogram [ECG] [EKG]: Secondary | ICD-10-CM | POA: Insufficient documentation

## 2011-09-14 DIAGNOSIS — R071 Chest pain on breathing: Secondary | ICD-10-CM | POA: Insufficient documentation

## 2011-09-14 LAB — CBC WITH DIFFERENTIAL/PLATELET
HCT: 35.1 % — ABNORMAL LOW (ref 36.0–46.0)
Hemoglobin: 11.6 g/dL — ABNORMAL LOW (ref 12.0–15.0)
Lymphs Abs: 2.3 10*3/uL (ref 0.7–4.0)
Monocytes Absolute: 0.6 10*3/uL (ref 0.1–1.0)
Monocytes Relative: 9 % (ref 3–12)
Neutro Abs: 4 10*3/uL (ref 1.7–7.7)
Neutrophils Relative %: 57 % (ref 43–77)
RBC: 4.94 MIL/uL (ref 3.87–5.11)

## 2011-09-14 LAB — COMPREHENSIVE METABOLIC PANEL
Alkaline Phosphatase: 48 U/L (ref 39–117)
BUN: 12 mg/dL (ref 6–23)
CO2: 22 mEq/L (ref 19–32)
Chloride: 105 mEq/L (ref 96–112)
GFR calc Af Amer: >90 mL/min (ref >90)
GFR calc non Af Amer: 88 mL/min — ABNORMAL LOW (ref >90)
Glucose, Bld: 103 mg/dL — ABNORMAL HIGH (ref 70–99)
Potassium: 3.4 mEq/L — ABNORMAL LOW (ref 3.5–5.1)
Total Bilirubin: 0.2 mg/dL — ABNORMAL LOW (ref 0.3–1.2)

## 2011-09-14 LAB — TROPONIN I: Troponin I: <0.3 ng/mL (ref <0.30)

## 2011-09-14 NOTE — ED Notes (Signed)
The pt is c/o mid-chest pain for 3 days with some tingling in her lt fingers.  She feels like something is sitting on her chest

## 2011-09-15 ENCOUNTER — Emergency Department (HOSPITAL_COMMUNITY)
Admission: EM | Admit: 2011-09-15 | Discharge: 2011-09-15 | Disposition: A | Discharge status: Home / Self Care | Payer: 59 | Attending: Emergency Medicine | Admitting: Emergency Medicine

## 2011-09-15 ENCOUNTER — Emergency Department (HOSPITAL_COMMUNITY): Payer: 59

## 2011-09-15 DIAGNOSIS — R0789 Other chest pain: Secondary | ICD-10-CM

## 2011-09-15 MED ORDER — KETOROLAC TROMETHAMINE 30 MG/ML IJ SOLN
30.0000 mg | Freq: Once | INTRAMUSCULAR | Status: AC
  Administered 2011-09-15: 30 mg via INTRAVENOUS
  Filled 2011-09-15: qty 1

## 2011-09-15 MED ORDER — GI COCKTAIL ~~LOC~~
30.0000 mL | Freq: Once | ORAL | Status: AC
  Administered 2011-09-15: 30 mL via ORAL
  Filled 2011-09-15: qty 30

## 2011-09-15 MED ORDER — OMEPRAZOLE 20 MG PO CPDR: 20.0000 mg | DELAYED_RELEASE_CAPSULE | Freq: Every day | ORAL | Status: DC

## 2011-09-15 MED ORDER — IBUPROFEN 600 MG PO TABS: 600.0000 mg | ORAL_TABLET | Freq: Four times a day (QID) | ORAL | Status: AC | PRN

## 2011-09-15 NOTE — ED Provider Notes (Signed)
1:00 AM  Date: 09/15/2011  Rate: 95  Rhythm: normal sinus rhythm  QRS Axis: normal  Intervals: normal QRS:  Poor R wave progression in precordial leads suggests possible old anterior myocardial infarction.  ST/T Wave abnormalities: normal  Conduction Disutrbances:none  Narrative Interpretation: Abnormal EKG  Old EKG Reviewed: none available    Carleene Cooper III, MD 09/15/11 [phone]

## 2011-09-15 NOTE — ED Provider Notes (Signed)
Medical screening examination/treatment/procedure(s) were performed by non-physician practitioner and as supervising physician I was immediately available for consultation/collaboration.  Jasmine Awe, MD 09/15/11 [phone]

## 2011-09-15 NOTE — ED Notes (Signed)
The patient is AOx4 and comfortable with her discharge instructions. 

## 2011-09-15 NOTE — ED Provider Notes (Signed)
History     CSN: [id number]  Arrival date & time 09/14/11  2136   First MD Initiated Contact with Patient 09/15/11 0029      Chief Complaint  Patient presents with  . Chest Pain    (Consider location/radiation/quality/duration/timing/severity/associated sxs/prior treatment) HPI Comments: Patient presents with a chief complaint of left sided chest pain.  Pain has been constant for the past 3 days.  She describes the pain as a feeling that something is sitting on her chest. Pain does not radiate.  Pain is associated with some numbness of fingers of her left hands.  She denies SOB, nausea, vomiting, diaphoresis, dizziness, lightheadedness, or syncope.  She states that she has never had pain like this before.  No prior cardiac history.  No prior history of DM, HTN, or hyperlipidemia.  She does not smoke.  She is unsure of any family history of cardiac disease.  She is currently on the Nuva-ring.  No prior history of DVT or PE.  No prolonged travel or surgeries in the past 4 weeks.  No LE pain or swelling.  No prior history of malignancy.    The history is provided by the patient.    History reviewed. No pertinent past medical history.  Past Surgical History  Procedure Date  . Tubal ligation     No family history on file.  History  Substance Use Topics  . Smoking status: Never Smoker   . Smokeless tobacco: Not on file  . Alcohol Use: No    OB History    Grav Para Term Preterm Abortions TAB SAB Ect Mult Living                  Review of Systems  Constitutional: Negative for fever, chills and diaphoresis.  Respiratory: Negative for cough, shortness of breath and wheezing.   Cardiovascular: Positive for chest pain. Negative for palpitations and leg swelling.  Gastrointestinal: Negative for nausea, vomiting and abdominal pain.  Musculoskeletal: Negative for back pain.  Skin: Negative for color change and rash.  Neurological: Negative for dizziness, syncope, light-headedness  and headaches.    Allergies  Review of patient's allergies indicates no known allergies.  Home Medications   Current Outpatient Rx  Name Route Sig Dispense Refill  . ASPIRIN 325 MG PO TABS Oral Take 650 mg by mouth once.    . ETONOGESTREL-ETHINYL ESTRADIOL 0.12-0.015 MG/24HR VA RING Vaginal Place 1 each vaginally every 28 (twenty-eight) days. Insert vaginally and leave in place for 3 consecutive weeks, then remove for 1 week.      BP 126/86  Pulse 88  Temp 98.3 F (36.8 C) (Oral)  Resp 20  SpO2 100%  LMP 08/14/2011  Physical Exam  Nursing note and vitals reviewed. Constitutional: She appears well-developed and well-nourished. No distress.  HENT:  Head: Normocephalic and atraumatic.  Mouth/Throat: Oropharynx is clear and moist.  Neck: Normal range of motion. Neck supple.  Cardiovascular: Normal rate, regular rhythm, normal heart sounds and intact distal pulses.   Pulmonary/Chest: Effort normal and breath sounds normal. No respiratory distress. She has no wheezes. She has no rales. She exhibits tenderness.  Abdominal: Soft. There is no tenderness.  Musculoskeletal: Normal range of motion.  Neurological: She is alert. She has normal strength. No sensory deficit.  Skin: Skin is warm and dry. No rash noted. She is not diaphoretic. No erythema.  Psychiatric: She has a normal mood and affect.    ED Course  Procedures (including critical care time)  Labs Reviewed  CBC WITH DIFFERENTIAL - Abnormal; Notable for the following:    Hemoglobin 11.6 (*)     HCT 35.1 (*)     MCV 71.1 (*)     MCH 23.5 (*)     All other components within normal limits  COMPREHENSIVE METABOLIC PANEL - Abnormal; Notable for the following:    Potassium 3.4 (*)     Glucose, Bld 103 (*)     Total Bilirubin 0.2 (*)     GFR calc non Af Amer 88 (*)     All other components within normal limits  TROPONIN I  D-DIMER, QUANTITATIVE   Dg Chest 2 View  09/15/2011  *RADIOLOGY REPORT*  Clinical Data: Chest  pain  CHEST - 2 VIEW  Comparison: 03/02/2008  Findings: Lungs are clear. No pleural effusion or pneumothorax. The cardiomediastinal contours are within normal limits. The visualized bones and soft tissues are without significant appreciable abnormality.  IMPRESSION: No radiographic evidence of acute cardiopulmonary process.   Original Report Authenticated By: Waneta Martins, M.D.      No diagnosis found.     2:01 AM Reassessed patient.  Pain has improved at this time.   MDM  Patient presenting today with a chief complaint of chest pain that has been present for the past 3 days. Chest wall tender to palpation.   No prior cardiac history.  No cardiac risk factors.  Normal EKG.  Troponin negative.  Due to the fact that the pain has been constant and present for 3 days with a normal EKG and negative troponin, feel that ACS is unlikely at this time.  D-dimer negative.  No acute findings on CXR.  Therefore, feel that patient can be discharged home.  Return precautions discussed with patient.  Patient in agreement with the plan.        Pascal Lux [place], PA-C 09/15/11 [phone]

## 2012-02-24 ENCOUNTER — Encounter (HOSPITAL_COMMUNITY): Payer: Self-pay | Admitting: Emergency Medicine

## 2012-02-24 ENCOUNTER — Emergency Department (HOSPITAL_COMMUNITY)
Admission: EM | Admit: 2012-02-24 | Discharge: 2012-02-24 | Disposition: A | Discharge status: Home / Self Care | Payer: 59 | Source: Home / Self Care | Attending: Family Medicine | Admitting: Family Medicine

## 2012-02-24 ENCOUNTER — Other Ambulatory Visit (HOSPITAL_COMMUNITY)
Admission: RE | Admit: 2012-02-24 | Discharge: 2012-02-24 | Disposition: A | Discharge status: Home / Self Care | Payer: 59 | Source: Ambulatory Visit | Attending: Family Medicine | Admitting: Family Medicine

## 2012-02-24 DIAGNOSIS — Z113 Encounter for screening for infections with a predominantly sexual mode of transmission: Secondary | ICD-10-CM | POA: Insufficient documentation

## 2012-02-24 DIAGNOSIS — N760 Acute vaginitis: Secondary | ICD-10-CM | POA: Insufficient documentation

## 2012-02-24 LAB — POCT URINALYSIS DIP (DEVICE)
Glucose, UA: NEGATIVE mg/dL
Specific Gravity, Urine: 1.02 (ref 1.005–1.030)
Urobilinogen, UA: 2 mg/dL — ABNORMAL HIGH (ref 0.0–1.0)
pH: 7 (ref 5.0–8.0)

## 2012-02-24 MED ORDER — FLUCONAZOLE 150 MG PO TABS: ORAL_TABLET | ORAL | Status: DC

## 2012-02-24 MED ORDER — METRONIDAZOLE 500 MG PO TABS: 500.0000 mg | ORAL_TABLET | Freq: Two times a day (BID) | ORAL | Status: DC

## 2012-02-24 MED ORDER — NAPROXEN 500 MG PO TABS: 500.0000 mg | ORAL_TABLET | Freq: Two times a day (BID) | ORAL | Status: DC

## 2012-02-24 NOTE — ED Notes (Signed)
Abdominal pain for one weeks.  Vaginal discharge, clear, no odor for 2 weeks.  Reports discharge is thicker.  Abdominal pain is , low abdomen and "cramping".  Reports no pain with urination, but does report urinating often and small amounts

## 2012-02-24 NOTE — ED Provider Notes (Signed)
History     CSN: [id number]  Arrival date & time 02/24/12  1003   First MD Initiated Contact with Patient 02/24/12 1021      Chief Complaint  Patient presents with  . Abdominal Pain    (Consider location/radiation/quality/duration/timing/severity/associated sxs/prior treatment) HPI Comments: 37 year old female nonsmoker here complaining of low abdominal cramping type of discomfort and vaginal discharge for 2 weeks. Denies burning on urination, frequency or hematuria. Patient last menstrual period was January 25. Denies fever or chills. No nausea vomiting or diarrhea. No headache or dizziness. Patient states she was using Nuvaring for contraception but recently removed due to vaginal discharge. Patient states that she has remote history of Chlamydia that was treated years ago. Patient denies being sexually active in the last 2 months.   History reviewed. No pertinent past medical history.  Past Surgical History  Procedure Date  . Tubal ligation   . Foot surgery     No family history on file.  History  Substance Use Topics  . Smoking status: Never Smoker   . Smokeless tobacco: Not on file  . Alcohol Use: No    OB History    Grav Para Term Preterm Abortions TAB SAB Ect Mult Living                  Review of Systems  Constitutional: Negative for fever, chills, activity change and appetite change.  Gastrointestinal: Positive for abdominal pain. Negative for nausea, vomiting and diarrhea.  Genitourinary: Positive for vaginal discharge. Negative for dysuria, frequency, hematuria, flank pain and vaginal bleeding.  Skin: Negative for rash.  Neurological: Negative for dizziness and headaches.    Allergies  Review of patient's allergies indicates no known allergies.  Home Medications   Current Outpatient Rx  Name  Route  Sig  Dispense  Refill  . ASPIRIN 325 MG PO TABS   Oral   Take 650 mg by mouth once.         . ETONOGESTREL-ETHINYL ESTRADIOL 0.12-0.015 MG/24HR VA  RING   Vaginal   Place 1 each vaginally every 28 (twenty-eight) days. Insert vaginally and leave in place for 3 consecutive weeks, then remove for 1 week.         Marland Kitchen FLUCONAZOLE 150 MG PO TABS      1 tab po q72h x2   2 tablet   0   . METRONIDAZOLE 500 MG PO TABS   Oral   Take 1 tablet (500 mg total) by mouth 2 (two) times daily.   14 tablet   0   . NAPROXEN 500 MG PO TABS   Oral   Take 1 tablet (500 mg total) by mouth 2 (two) times daily with a meal.   20 tablet   0     As needed.   Marland Kitchen OMEPRAZOLE 20 MG PO CPDR   Oral   Take 1 capsule (20 mg total) by mouth daily.   30 capsule   0     BP 136/97  Pulse 95  Temp 97.3 F (36.3 C) (Oral)  Resp 18  SpO2 99%  Physical Exam  Nursing note and vitals reviewed. Constitutional: She is oriented to person, place, and time. She appears well-developed and well-nourished. No distress.  HENT:  Head: Normocephalic and atraumatic.  Eyes: Conjunctivae normal are normal. No scleral icterus.  Cardiovascular: Normal heart sounds.   Pulmonary/Chest: Breath sounds normal.  Abdominal: Soft. She exhibits no distension and no mass. There is no tenderness. There is no rebound and  no guarding.  Genitourinary: Uterus normal. There is no rash on the right labia. There is no rash on the left labia. Cervix exhibits no motion tenderness, no discharge and no friability. Right adnexum displays no mass, no tenderness and no fullness. Left adnexum displays no mass, no tenderness and no fullness. There is erythema around the vagina. No bleeding around the vagina. Vaginal discharge found.  Neurological: She is alert and oriented to person, place, and time.  Skin: No rash noted. She is not diaphoretic.    ED Course  Procedures (including critical care time)   Labs Reviewed  POCT PREGNANCY, URINE  CERVICOVAGINAL ANCILLARY ONLY   No results found.   1. Vaginitis       MDM  37 year old female with low abdominal cramping and vaginal discharge  for 2 weeks. Denies urinary symptoms point-of-care urine with Negative LE and nitrates. No tenderness to cervical motion. And otherwise reassuring abdominal and pelvic exam. Vaginal discharge treated empirically with metronidazole and Diflucan. Wet prep, GC and Chlamydia pending at the time of discharge.        Sharin Grave, MD 02/24/12 1153

## 2012-02-24 NOTE — ED Notes (Signed)
Patient went to bathroom to obtain specimen, patient equipment at bedside.

## 2012-02-25 NOTE — ED Notes (Signed)
GC/Chlamydia pend., Affirm: Gardnerella pos., Candida and Trich neg., Pt. adequately treated with Flagyl. Vassie Moselle 02/25/2012

## 2012-02-29 NOTE — ED Notes (Signed)
GC/Chlamydia neg. Vassie Moselle 02/29/2012

## 2012-11-13 ENCOUNTER — Encounter (HOSPITAL_COMMUNITY): Payer: Self-pay | Admitting: Emergency Medicine

## 2012-11-13 ENCOUNTER — Emergency Department (HOSPITAL_COMMUNITY)
Admission: EM | Admit: 2012-11-13 | Discharge: 2012-11-13 | Disposition: A | Discharge status: Home / Self Care | Payer: 59 | Source: Home / Self Care | Attending: Family Medicine | Admitting: Family Medicine

## 2012-11-13 DIAGNOSIS — N390 Urinary tract infection, site not specified: Secondary | ICD-10-CM

## 2012-11-13 LAB — POCT URINALYSIS DIP (DEVICE)
Bilirubin Urine: NEGATIVE
Glucose, UA: NEGATIVE mg/dL
Nitrite: NEGATIVE
Urobilinogen, UA: 2 mg/dL — ABNORMAL HIGH (ref 0.0–1.0)
pH: 7 (ref 5.0–8.0)

## 2012-11-13 MED ORDER — CEPHALEXIN 500 MG PO CAPS
500.0000 mg | ORAL_CAPSULE | Freq: Four times a day (QID) | ORAL | Status: DC
Start: 2012-11-13 — End: 2015-08-31

## 2012-11-13 MED ORDER — IBUPROFEN 800 MG PO TABS
ORAL_TABLET | ORAL | Status: AC
  Filled 2012-11-13: qty 1

## 2012-11-13 MED ORDER — IBUPROFEN 800 MG PO TABS
800.0000 mg | ORAL_TABLET | Freq: Once | ORAL | Status: AC
  Administered 2012-11-13: 800 mg via ORAL

## 2012-11-13 NOTE — ED Provider Notes (Signed)
CSN: [id number]     Arrival date & time 11/13/12  1950 History   First MD Initiated Contact with Patient 11/13/12 2046     Chief Complaint  Patient presents with  . Urinary Tract Infection   (Consider location/radiation/quality/duration/timing/severity/associated sxs/prior Treatment) Patient is a 37 y.o. female presenting with frequency.  Urinary Frequency This is a new problem. The current episode started yesterday. The problem has not changed since onset.Pertinent negatives include no chest pain and no abdominal pain.    History reviewed. No pertinent past medical history. Past Surgical History  Procedure Laterality Date  . Tubal ligation    . Foot surgery     No family history on file. History  Substance Use Topics  . Smoking status: Never Smoker   . Smokeless tobacco: Not on file  . Alcohol Use: No   OB History   Grav Para Term Preterm Abortions TAB SAB Ect Mult Living                 Review of Systems  Constitutional: Negative.  Negative for fever.  Cardiovascular: Negative for chest pain.  Gastrointestinal: Negative.  Negative for nausea, vomiting and abdominal pain.  Genitourinary: Positive for dysuria, urgency, frequency and vaginal bleeding. Negative for flank pain, vaginal discharge and menstrual problem.  Musculoskeletal: Negative for back pain.    Allergies  Review of patient's allergies indicates no known allergies.  Home Medications   Current Outpatient Rx  Name  Route  Sig  Dispense  Refill  . aspirin 325 MG tablet   Oral   Take 650 mg by mouth once.         . cephALEXin (KEFLEX) 500 MG capsule   Oral   Take 1 capsule (500 mg total) by mouth 4 (four) times daily. Take all of medicine and drink lots of fluids   20 capsule   0   . etonogestrel-ethinyl estradiol (NUVARING) 0.12-0.015 MG/24HR vaginal ring   Vaginal   Place 1 each vaginally every 28 (twenty-eight) days. Insert vaginally and leave in place for 3 consecutive weeks, then remove for  1 week.         . fluconazole (DIFLUCAN) 150 MG tablet      1 tab po q72h x2   2 tablet   0   . metroNIDAZOLE (FLAGYL) 500 MG tablet   Oral   Take 1 tablet (500 mg total) by mouth 2 (two) times daily.   14 tablet   0   . naproxen (NAPROSYN) 500 MG tablet   Oral   Take 1 tablet (500 mg total) by mouth 2 (two) times daily with a meal.   20 tablet   0     As needed.   Marland Kitchen EXPIRED: omeprazole (PRILOSEC) 20 MG capsule   Oral   Take 1 capsule (20 mg total) by mouth daily.   30 capsule   0    LMP 10/15/2012 Physical Exam  Nursing note and vitals reviewed. Constitutional: She is oriented to person, place, and time. She appears well-developed and well-nourished.  Abdominal: Soft. Bowel sounds are normal. She exhibits no mass. There is no tenderness. There is no rebound and no guarding.  Neurological: She is alert and oriented to person, place, and time.  Skin: Skin is warm.    ED Course  Procedures (including critical care time) Labs Review Labs Reviewed  POCT URINALYSIS DIP (DEVICE) - Abnormal; Notable for the following:    Ketones, ur TRACE (*)    Hgb urine dipstick  MODERATE (*)    Protein, ur 30 (*)    Urobilinogen, UA 2.0 (*)    Leukocytes, UA SMALL (*)    All other components within normal limits   Imaging Review No results found.    MDM     Linna Hoff, MD 11/13/12 2112

## 2012-11-13 NOTE — ED Notes (Signed)
Dysuria, odor to urine, onset yesterday

## 2013-05-08 ENCOUNTER — Other Ambulatory Visit: Payer: Self-pay | Admitting: Dermatology

## 2013-06-13 ENCOUNTER — Encounter (HOSPITAL_COMMUNITY): Payer: Self-pay | Admitting: Emergency Medicine

## 2013-06-13 ENCOUNTER — Other Ambulatory Visit (HOSPITAL_COMMUNITY)
Admission: RE | Admit: 2013-06-13 | Discharge: 2013-06-13 | Disposition: A | Discharge status: Home / Self Care | Payer: 59 | Source: Ambulatory Visit | Attending: Family Medicine | Admitting: Family Medicine

## 2013-06-13 ENCOUNTER — Emergency Department (HOSPITAL_COMMUNITY)
Admission: EM | Admit: 2013-06-13 | Discharge: 2013-06-13 | Disposition: A | Discharge status: Home / Self Care | Payer: 59 | Source: Home / Self Care | Attending: Family Medicine | Admitting: Family Medicine

## 2013-06-13 DIAGNOSIS — N760 Acute vaginitis: Secondary | ICD-10-CM | POA: Insufficient documentation

## 2013-06-13 DIAGNOSIS — Z113 Encounter for screening for infections with a predominantly sexual mode of transmission: Secondary | ICD-10-CM | POA: Insufficient documentation

## 2013-06-13 DIAGNOSIS — R109 Unspecified abdominal pain: Secondary | ICD-10-CM

## 2013-06-13 LAB — POCT URINALYSIS DIP (DEVICE)
Bilirubin Urine: NEGATIVE
Glucose, UA: NEGATIVE mg/dL
HGB URINE DIPSTICK: NEGATIVE
Ketones, ur: NEGATIVE mg/dL
Leukocytes, UA: NEGATIVE
Nitrite: NEGATIVE
Protein, ur: NEGATIVE mg/dL
Specific Gravity, Urine: 1.02 (ref 1.005–1.030)
UROBILINOGEN UA: 0.2 mg/dL (ref 0.0–1.0)
pH: 7.5 (ref 5.0–8.0)

## 2013-06-13 LAB — POCT PREGNANCY, URINE: Preg Test, Ur: NEGATIVE

## 2013-06-13 NOTE — ED Provider Notes (Addendum)
CSN: [id number]     Arrival date & time 06/13/13  [id number] History   None    No chief complaint on file.  (Consider location/radiation/quality/duration/timing/severity/associated sxs/prior Treatment) Patient is a 38 y.o. female presenting with abdominal pain. The history is provided by the patient. No language interpreter was used.  Abdominal Pain Pain location:  Suprapubic Pain quality: aching   Pain radiates to:  Does not radiate Pain severity:  Moderate Timing:  Constant Progression:  Worsening Relieved by:  Nothing Worsened by:  Nothing tried Ineffective treatments:  None tried Associated symptoms: no nausea, no vaginal bleeding and no vaginal discharge     No past medical history on file. Past Surgical History  Procedure Laterality Date  . Tubal ligation    . Foot surgery     No family history on file. History  Substance Use Topics  . Smoking status: Never Smoker   . Smokeless tobacco: Not on file  . Alcohol Use: No   OB History   Grav Para Term Preterm Abortions TAB SAB Ect Mult Living                 Review of Systems  Gastrointestinal: Positive for abdominal pain. Negative for nausea.  Genitourinary: Negative for vaginal bleeding and vaginal discharge.  All other systems reviewed and are negative.   Allergies  Review of patient's allergies indicates no known allergies.  Home Medications   Prior to Admission medications   Medication Sig Start Date End Date Taking? Authorizing Provider  aspirin 325 MG tablet Take 650 mg by mouth once.    Historical Provider, MD  cephALEXin (KEFLEX) 500 MG capsule Take 1 capsule (500 mg total) by mouth 4 (four) times daily. Take all of medicine and drink lots of fluids 11/13/12   Linna HoffJames D Kindl, MD  etonogestrel-ethinyl estradiol (NUVARING) 0.12-0.015 MG/24HR vaginal ring Place 1 each vaginally every 28 (twenty-eight) days. Insert vaginally and leave in place for 3 consecutive weeks, then remove for 1 week.    Historical Provider,  MD  fluconazole (DIFLUCAN) 150 MG tablet 1 tab po q72h x2 02/24/12   Adlih Moreno-Coll, MD  metroNIDAZOLE (FLAGYL) 500 MG tablet Take 1 tablet (500 mg total) by mouth 2 (two) times daily. 02/24/12   Adlih Moreno-Coll, MD  naproxen (NAPROSYN) 500 MG tablet Take 1 tablet (500 mg total) by mouth 2 (two) times daily with a meal. 02/24/12   Adlih Moreno-Coll, MD  omeprazole (PRILOSEC) 20 MG capsule Take 1 capsule (20 mg total) by mouth daily. 09/15/11 09/14/12  Santiago GladHeather Laisure, PA-C   There were no vitals taken for this visit. Physical Exam  Nursing note and vitals reviewed. Constitutional: She is oriented to person, place, and time. She appears well-developed and well-nourished.  HENT:  Head: Normocephalic and atraumatic.  Eyes: Conjunctivae and EOM are normal. Pupils are equal, round, and reactive to light.  Neck: Normal range of motion. Neck supple.  Cardiovascular: Normal rate.   Pulmonary/Chest: Effort normal.  Abdominal: Soft. She exhibits no distension. There is no tenderness.  Genitourinary: Vaginal discharge found.  Vaginal discharge,  Thick white,  Adnexa no masses,  Cervix nontender  Musculoskeletal: Normal range of motion.  Neurological: She is alert and oriented to person, place, and time.  Skin: Skin is warm.  Psychiatric: She has a normal mood and affect.    ED Course  Procedures (including critical care time) Labs Review Labs Reviewed - No data to display  Imaging Review No results found.   MDM  1. Abdominal pain    Gc/ct and affirm pending     Elson Areas, PA-C 06/13/13 [address] [place], [place] 07/03/13 1705

## 2013-06-13 NOTE — Discharge Instructions (Signed)
Abdominal Pain, Adult °Many things can cause abdominal pain. Usually, abdominal pain is not caused by a disease and will improve without treatment. It can often be observed and treated at home. Your health care provider will do a physical exam and possibly order blood tests and X-rays to help determine the seriousness of your pain. However, in many cases, more time must pass before a clear cause of the pain can be found. Before that point, your health care provider may not know if you need more testing or further treatment. °HOME CARE INSTRUCTIONS  °Monitor your abdominal pain for any changes. The following actions may help to alleviate any discomfort you are experiencing: °· Only take over-the-counter or prescription medicines as directed by your health care provider. °· Do not take laxatives unless directed to do so by your health care provider. °· Try a clear liquid diet (broth, tea, or water) as directed by your health care provider. Slowly move to a bland diet as tolerated. °SEEK MEDICAL CARE IF: °· You have unexplained abdominal pain. °· You have abdominal pain associated with nausea or diarrhea. °· You have pain when you urinate or have a bowel movement. °· You experience abdominal pain that wakes you in the night. °· You have abdominal pain that is worsened or improved by eating food. °· You have abdominal pain that is worsened with eating fatty foods. °SEEK IMMEDIATE MEDICAL CARE IF:  °· Your pain does not go away within 2 hours. °· You have a fever. °· You keep throwing up (vomiting). °· Your pain is felt only in portions of the abdomen, such as the right side or the left lower portion of the abdomen. °· You pass bloody or black tarry stools. °MAKE SURE YOU: °· Understand these instructions.   °· Will watch your condition.   °· Will get help right away if you are not doing well or get worse.   °Document Released: 10/14/2004 Document Revised: 10/25/2012 Document Reviewed: 09/13/2012 °Exit[employer]® Patient  Information ©2014 [employer]. ° °

## 2013-06-13 NOTE — ED Notes (Signed)
Low abdominal pain, no nausea, no vomiting, denies vaginal discharge, denies urinary symptoms.  Reports abdomen as "aching" pain , intermittent for 3 weeks.  Patient denies seeing any pattern to pain .

## 2013-06-14 NOTE — ED Provider Notes (Signed)
Medical screening examination/treatment/procedure(s) were performed by a resident physician or non-physician practitioner and as the supervising physician I was immediately available for consultation/collaboration.  Lexander Tremblay, MD   Harpreet Signore S Navada Osterhout, MD 06/14/13 1444 

## 2013-06-23 NOTE — ED Notes (Signed)
Treatment adequate w flagyl for positive gardnerella

## 2013-06-29 ENCOUNTER — Telehealth (HOSPITAL_COMMUNITY): Payer: Self-pay | Admitting: *Deleted

## 2013-06-29 NOTE — ED Notes (Signed)
Order for Flagyl from [facility] if pt. not treated for bacterial vaginosis.  I called pt. and left a message to call. Vassie MoselleYork, Cheyenne Bordeaux M 06/29/2013

## 2013-07-03 MED ORDER — METRONIDAZOLE 500 MG PO TABS: 500.0000 mg | ORAL_TABLET | Freq: Two times a day (BID) | ORAL | Status: DC

## 2013-07-03 NOTE — ED Notes (Signed)
Pt. called back.  Pt. verified x 2 and given results.  Pt. told she has a Rx. of Flagyl for bacterial vaginosis from the PA if still having symptoms.  She said she is still having symptoms.   Pt. instructed to no alcohol while taking this medication.  Pt. wants Rx. called to [facility] on [address].  Rx. called to pharmacist @ [phone]. Vassie MoselleYork, Josia Cueva M 07/03/2013

## 2013-07-04 NOTE — ED Provider Notes (Signed)
Medical screening examination/treatment/procedure(s) were performed by a resident physician or non-physician practitioner and as the supervising physician I was immediately available for consultation/collaboration.  Mckynna Vanloan, MD    Camarion Weier S Kaydie Petsch, MD 07/04/13 0734 

## 2013-09-17 ENCOUNTER — Other Ambulatory Visit (HOSPITAL_COMMUNITY): Payer: Self-pay | Admitting: Internal Medicine

## 2013-09-17 ENCOUNTER — Ambulatory Visit (HOSPITAL_COMMUNITY)
Admission: RE | Admit: 2013-09-17 | Discharge: 2013-09-17 | Disposition: A | Discharge status: Home / Self Care | Payer: 59 | Source: Ambulatory Visit | Attending: Internal Medicine | Admitting: Internal Medicine

## 2013-09-17 DIAGNOSIS — R5383 Other fatigue: Principal | ICD-10-CM

## 2013-09-17 DIAGNOSIS — R5381 Other malaise: Secondary | ICD-10-CM | POA: Insufficient documentation

## 2013-09-17 DIAGNOSIS — R51 Headache: Secondary | ICD-10-CM | POA: Insufficient documentation

## 2013-09-17 MED ORDER — IOHEXOL 300 MG/ML  SOLN
100.0000 mL | Freq: Once | INTRAMUSCULAR | Status: AC | PRN
  Administered 2013-09-17: 100 mL via INTRAVENOUS

## 2014-04-24 ENCOUNTER — Other Ambulatory Visit (HOSPITAL_COMMUNITY)
Admission: RE | Admit: 2014-04-24 | Discharge: 2014-04-24 | Disposition: A | Discharge status: Home / Self Care | Payer: 59 | Source: Ambulatory Visit | Attending: Obstetrics and Gynecology | Admitting: Obstetrics and Gynecology

## 2014-04-24 ENCOUNTER — Other Ambulatory Visit: Payer: Self-pay | Admitting: Obstetrics and Gynecology

## 2014-04-24 DIAGNOSIS — Z01419 Encounter for gynecological examination (general) (routine) without abnormal findings: Secondary | ICD-10-CM | POA: Diagnosis not present

## 2014-04-24 DIAGNOSIS — Z113 Encounter for screening for infections with a predominantly sexual mode of transmission: Secondary | ICD-10-CM | POA: Insufficient documentation

## 2014-04-24 DIAGNOSIS — Z1151 Encounter for screening for human papillomavirus (HPV): Secondary | ICD-10-CM | POA: Diagnosis present

## 2014-04-25 LAB — CYTOLOGY - PAP

## 2014-09-06 ENCOUNTER — Emergency Department (HOSPITAL_COMMUNITY)
Admission: EM | Admit: 2014-09-06 | Discharge: 2014-09-06 | Disposition: A | Discharge status: Home / Self Care | Payer: 59 | Source: Home / Self Care | Attending: Family Medicine | Admitting: Family Medicine

## 2014-09-06 ENCOUNTER — Encounter (HOSPITAL_COMMUNITY): Payer: Self-pay | Admitting: Emergency Medicine

## 2014-09-06 DIAGNOSIS — B3731 Acute candidiasis of vulva and vagina: Secondary | ICD-10-CM

## 2014-09-06 DIAGNOSIS — B373 Candidiasis of vulva and vagina: Secondary | ICD-10-CM

## 2014-09-06 MED ORDER — FLUCONAZOLE 150 MG PO TABS: 150.0000 mg | ORAL_TABLET | Freq: Once | ORAL | Status: DC

## 2014-09-06 MED ORDER — TERCONAZOLE 80 MG VA SUPP: 80.0000 mg | Freq: Every day | VAGINAL | Status: DC

## 2014-09-06 NOTE — ED Provider Notes (Signed)
CSN: [id number]     Arrival date & time 09/06/14  1921 History   First MD Initiated Contact with Patient 09/06/14 1937     No chief complaint on file.  (Consider location/radiation/quality/duration/timing/severity/associated sxs/prior Treatment) Patient is a 39 y.o. female presenting with vaginal itching. The history is provided by the patient.  Vaginal Itching This is a new problem. The current episode started more than 1 week ago (has h/o vag yeast problemm otherwise neg ros.). The problem has not changed since onset.Pertinent negatives include no chest pain and no abdominal pain.    History reviewed. No pertinent past medical history. Past Surgical History  Procedure Laterality Date  . Tubal ligation    . Foot surgery     No family history on file. Social History  Substance Use Topics  . Smoking status: Never Smoker   . Smokeless tobacco: None  . Alcohol Use: No   OB History    No data available     Review of Systems  Constitutional: Negative.   Cardiovascular: Negative for chest pain.  Gastrointestinal: Negative.  Negative for abdominal pain.  Genitourinary: Positive for vaginal discharge. Negative for dysuria, frequency and vaginal bleeding.    Allergies  Review of patient's allergies indicates no known allergies.  Home Medications   Prior to Admission medications   Medication Sig Start Date End Date Taking? Authorizing Provider  aspirin 325 MG tablet Take 650 mg by mouth once.    Historical Provider, MD  cephALEXin (KEFLEX) 500 MG capsule Take 1 capsule (500 mg total) by mouth 4 (four) times daily. Take all of medicine and drink lots of fluids 11/13/12   Linna Hoff, MD  etonogestrel-ethinyl estradiol (NUVARING) 0.12-0.015 MG/24HR vaginal ring Place 1 each vaginally every 28 (twenty-eight) days. Insert vaginally and leave in place for 3 consecutive weeks, then remove for 1 week.    Historical Provider, MD  fluconazole (DIFLUCAN) 150 MG tablet Take 1 tablet (150 mg  total) by mouth once. Repeat in 1 week 09/06/14   Linna Hoff, MD  metroNIDAZOLE (FLAGYL) 500 MG tablet Take 1 tablet (500 mg total) by mouth 2 (two) times daily. 02/24/12   Adlih Moreno-Coll, MD  metroNIDAZOLE (FLAGYL) 500 MG tablet Take 1 tablet (500 mg total) by mouth 2 (two) times daily. 07/03/13   Elson Areas, PA-C  naproxen (NAPROSYN) 500 MG tablet Take 1 tablet (500 mg total) by mouth 2 (two) times daily with a meal. 02/24/12   Adlih Moreno-Coll, MD  omeprazole (PRILOSEC) 20 MG capsule Take 1 capsule (20 mg total) by mouth daily. 09/15/11 09/14/12  Santiago Glad, PA-C  terconazole (TERAZOL 3) 80 MG vaginal suppository Place 1 suppository (80 mg total) vaginally at bedtime. 09/06/14   Linna Hoff, MD   BP 122/89 mmHg  Pulse 111  Temp(Src) 98.4 F (36.9 C) (Oral)  Resp 16  SpO2 100% Physical Exam  Constitutional: She is oriented to person, place, and time. She appears well-developed and well-nourished.  Neck: Normal range of motion. Neck supple.  Abdominal: Soft. Bowel sounds are normal. She exhibits no distension and no mass. There is no tenderness. There is no rebound and no guarding.  Lymphadenopathy:    She has no cervical adenopathy.  Neurological: She is alert and oriented to person, place, and time.  Skin: Skin is warm and dry.  Nursing note and vitals reviewed.   ED Course  Procedures (including critical care time) Labs Review Labs Reviewed - No data to display  Imaging Review  No results found.   MDM   1. Candidal vaginitis       Linna Hoff, MD 09/06/14 2009

## 2014-11-21 ENCOUNTER — Other Ambulatory Visit: Payer: Self-pay | Admitting: Internal Medicine

## 2014-11-21 ENCOUNTER — Other Ambulatory Visit: Payer: Self-pay

## 2014-11-21 DIAGNOSIS — Z1231 Encounter for screening mammogram for malignant neoplasm of breast: Secondary | ICD-10-CM

## 2014-12-30 ENCOUNTER — Ambulatory Visit
Admission: RE | Admit: 2014-12-30 | Discharge: 2014-12-30 | Disposition: A | Discharge status: Home / Self Care | Payer: 59 | Source: Ambulatory Visit

## 2014-12-30 DIAGNOSIS — Z1231 Encounter for screening mammogram for malignant neoplasm of breast: Secondary | ICD-10-CM

## 2015-03-11 MED FILL — AMITRIPTYLINE HCL 50 MG TAB: 50 | 30 days supply | Qty: 30 | Fill #1

## 2015-04-25 DIAGNOSIS — Z01411 Encounter for gynecological examination (general) (routine) with abnormal findings: Secondary | ICD-10-CM | POA: Diagnosis not present

## 2015-04-25 DIAGNOSIS — Z803 Family history of malignant neoplasm of breast: Secondary | ICD-10-CM | POA: Diagnosis not present

## 2015-04-25 DIAGNOSIS — R102 Pelvic and perineal pain: Secondary | ICD-10-CM | POA: Diagnosis not present

## 2015-04-25 DIAGNOSIS — N907 Vulvar cyst: Secondary | ICD-10-CM | POA: Diagnosis not present

## 2015-04-25 DIAGNOSIS — B001 Herpesviral vesicular dermatitis: Secondary | ICD-10-CM | POA: Diagnosis not present

## 2015-04-25 DIAGNOSIS — Z3044 Encounter for surveillance of vaginal ring hormonal contraceptive device: Secondary | ICD-10-CM | POA: Diagnosis not present

## 2015-04-25 DIAGNOSIS — N760 Acute vaginitis: Secondary | ICD-10-CM | POA: Diagnosis not present

## 2015-04-25 MED FILL — valACYclovir HCL 1 GM TABS: 1 | 7 days supply | Qty: 28 | Fill #0

## 2015-04-25 MED FILL — metroNIDAZOLE 500 MG TABS: 500 | 7 days supply | Qty: 14 | Fill #0

## 2015-04-25 MED FILL — NUVARING VAGINAL RING: 0.12-0.015 | 84 days supply | Qty: 3 | Fill #0

## 2015-05-01 ENCOUNTER — Telehealth: Payer: Self-pay | Admitting: Genetic Counselor

## 2015-05-01 ENCOUNTER — Encounter: Payer: Self-pay | Admitting: Genetic Counselor

## 2015-05-01 NOTE — Telephone Encounter (Signed)
Verified address and insurance, faxed referring office letter, mailed new pt packet, scheduled intake

## 2015-05-15 MED FILL — AMITRIPTYLINE HCL 50 MG TAB: 50 | 30 days supply | Qty: 30 | Fill #2

## 2015-05-28 IMAGING — CT CT HEAD WO/W CM
1 of 2 series · 13 of 30 positions shown, 17 images · IV contrast (OMNIPAQUE)
Comparison: None.

CLINICAL DATA: Fatigue.  Headache.

EXAM:
CT HEAD WITHOUT AND WITH CONTRAST
TECHNIQUE: Contiguous axial images were obtained from the base of the skull
through the vertex without and with intravenous contrast
CONTRAST:  100mL OMNIPAQUE IOHEXOL 300 MG/ML  SOLN

[Series 2: head w/o 4.8 h45s · axial · non-contrast · 0.43mm/px · z∈[-221,-107]mm · 13 of 30 slices shown, 17 images]
[im 3/30  brain]
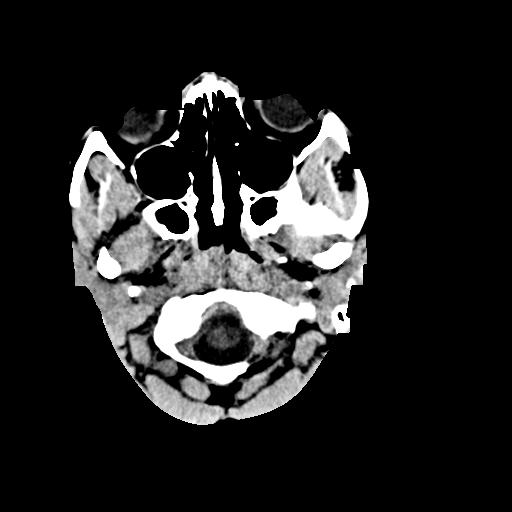
[im 3/30  bone]
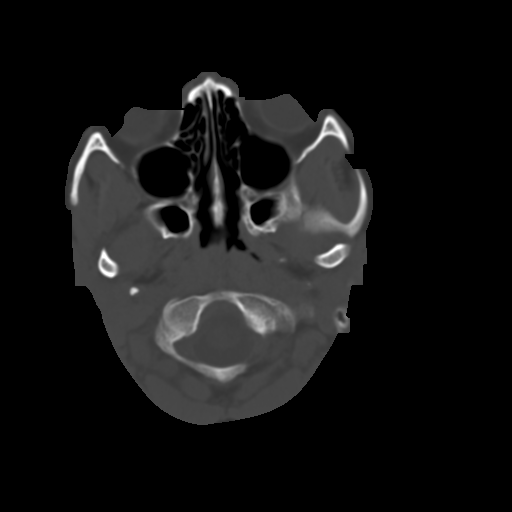
[im 5/30  brain]
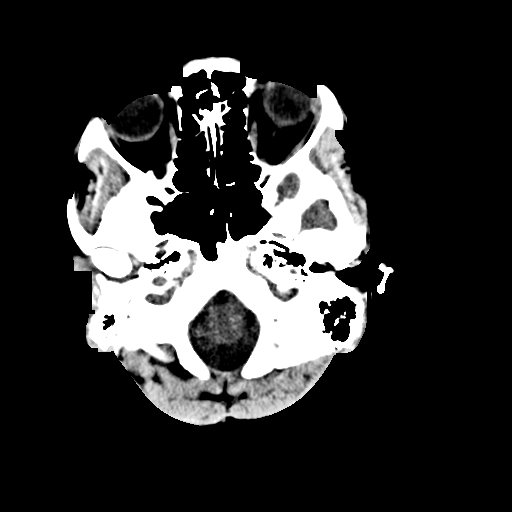
[im 7/30  brain]
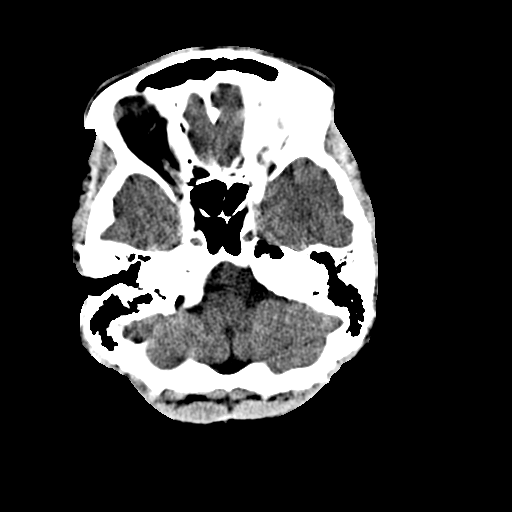
[im 9/30  brain]
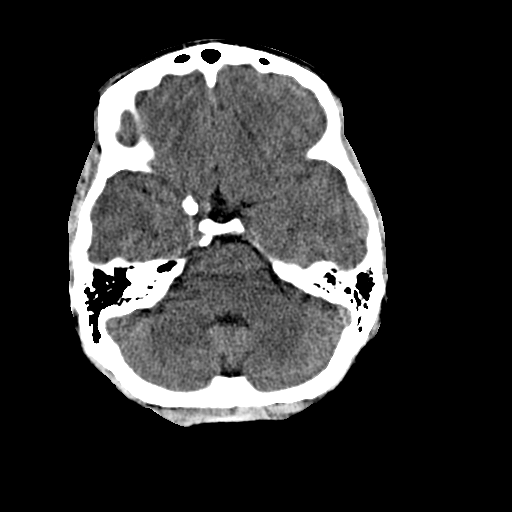
[im 11/30  brain]
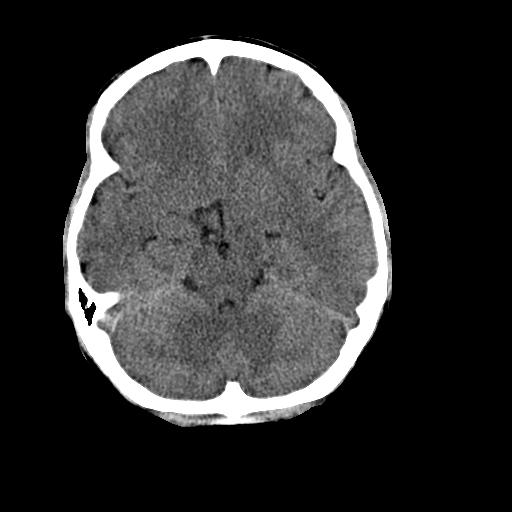
[im 11/30  bone]
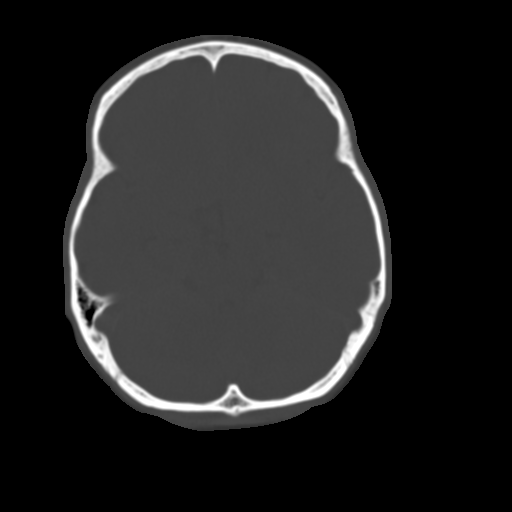
[im 13/30  brain]
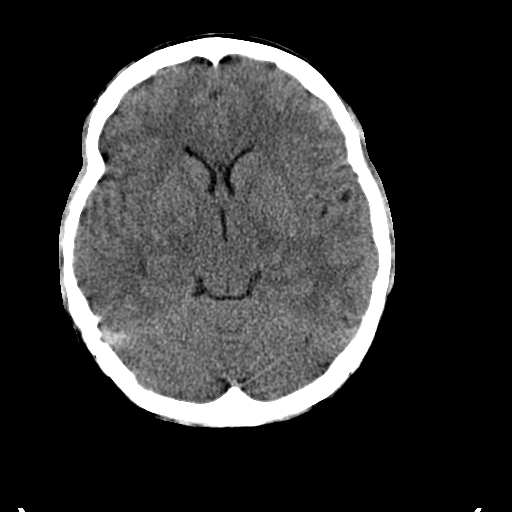
[im 15/30  brain]
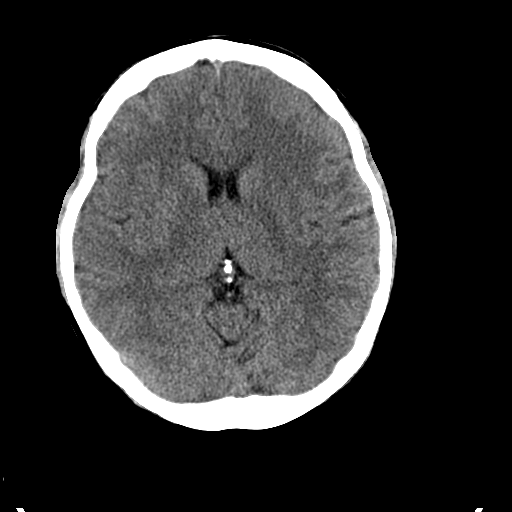
[im 17/30  brain]
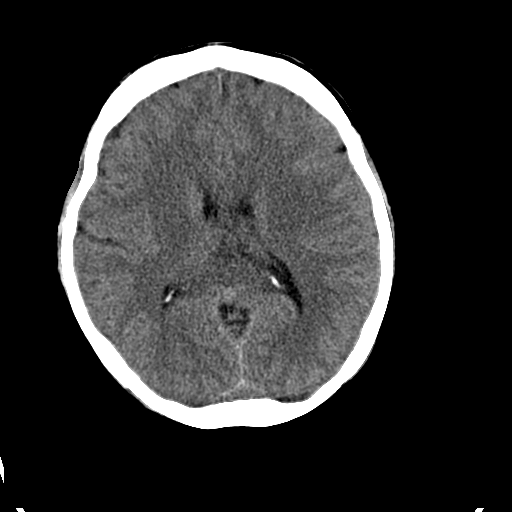
[im 19/30  brain]
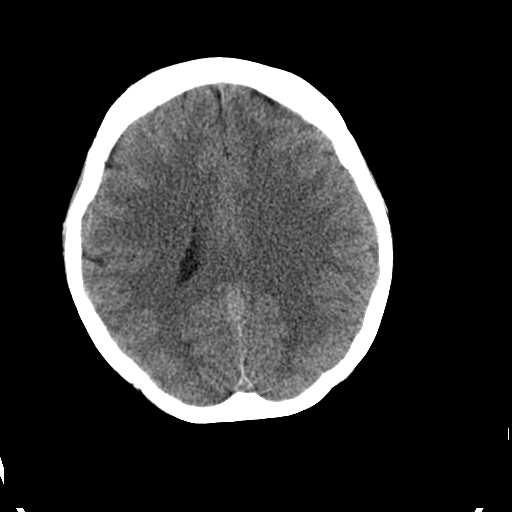
[im 19/30  bone]
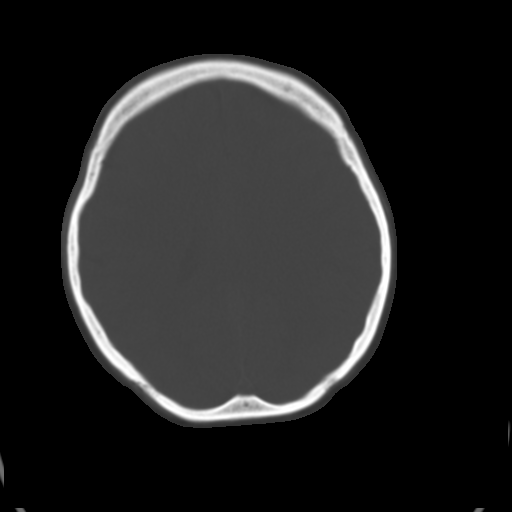
[im 21/30  brain]
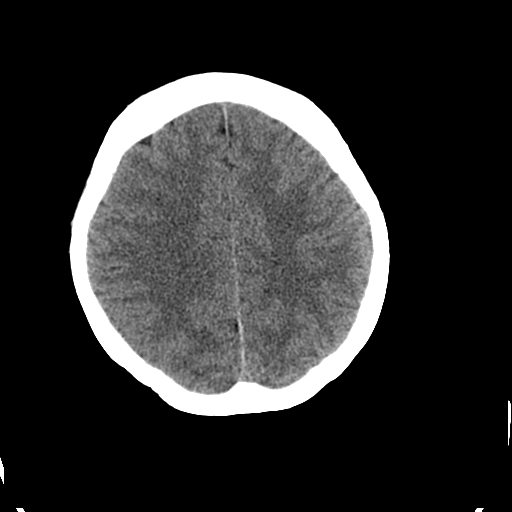
[im 23/30  brain]
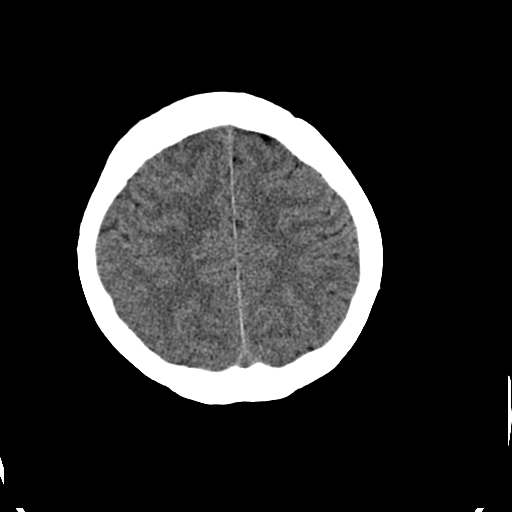
[im 25/30  brain]
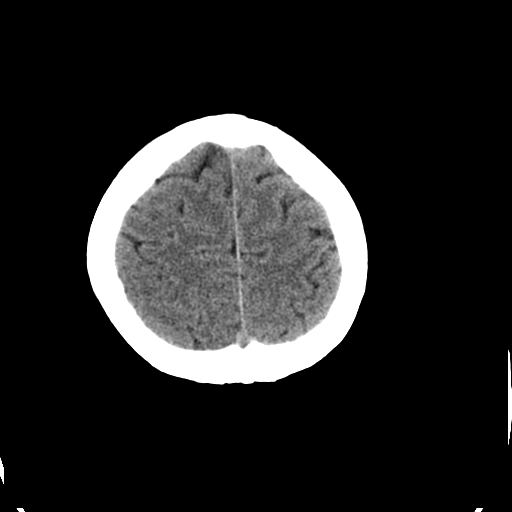
[im 27/30  brain]
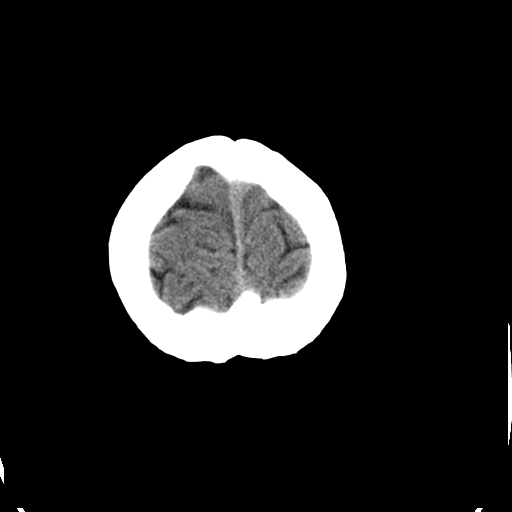
[im 27/30  bone]
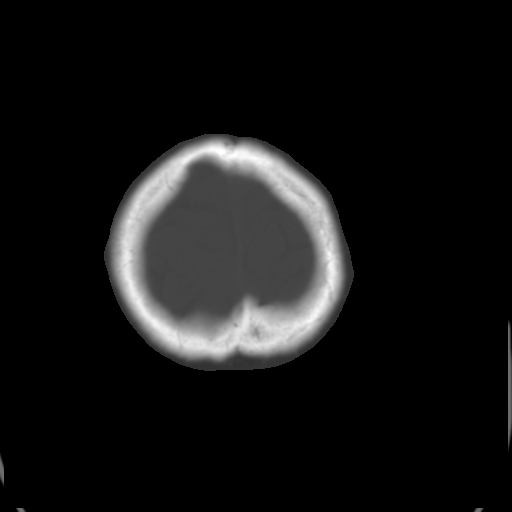

[13 of 30 positions shown; findings below may reference images not displayed]

FINDINGS: Ventricle size is normal. Negative for acute or chronic infarction.
Negative for hemorrhage or fluid collection. Negative for mass or
edema. No shift of the midline structures. Normal enhancement
following contrast administration

Calvarium is intact.
IMPRESSION: Normal

## 2015-06-09 ENCOUNTER — Other Ambulatory Visit: Payer: 59

## 2015-06-09 ENCOUNTER — Encounter: Payer: 59 | Admitting: Genetic Counselor

## 2015-06-12 ENCOUNTER — Encounter: Payer: 59 | Admitting: Genetic Counselor

## 2015-06-12 ENCOUNTER — Other Ambulatory Visit: Payer: 59

## 2015-06-13 ENCOUNTER — Telehealth: Payer: Self-pay | Admitting: Genetic Counselor

## 2015-06-13 ENCOUNTER — Encounter: Payer: Self-pay | Admitting: Genetic Counselor

## 2015-06-13 NOTE — Telephone Encounter (Signed)
Pt called in to reschedule appt missed 5/25.  Requested a new pt packet be mailed out

## 2015-07-24 ENCOUNTER — Encounter: Payer: 59 | Admitting: Genetic Counselor

## 2015-07-24 ENCOUNTER — Other Ambulatory Visit: Payer: 59

## 2015-08-11 ENCOUNTER — Telehealth: Payer: Self-pay | Admitting: *Deleted

## 2015-08-11 MED FILL — AMITRIPTYLINE HCL 50 MG TAB: 50 | 30 days supply | Qty: 30 | Fill #3

## 2015-08-11 NOTE — Telephone Encounter (Signed)
"  I missed my appointment, called and left a message and no one has called me back yet."   07-24-2015 genetic counseling and lab was scheduled.  Asked if she chose other option to reach genetic counseling and call lost.  Will notify Genetic counseling patient would like to reschedule.

## 2015-08-31 ENCOUNTER — Ambulatory Visit (INDEPENDENT_AMBULATORY_CARE_PROVIDER_SITE_OTHER): Payer: 59

## 2015-08-31 ENCOUNTER — Encounter (HOSPITAL_COMMUNITY): Payer: Self-pay | Admitting: Emergency Medicine

## 2015-08-31 ENCOUNTER — Ambulatory Visit (HOSPITAL_COMMUNITY)
Admission: EM | Admit: 2015-08-31 | Discharge: 2015-08-31 | Disposition: A | Discharge status: Home / Self Care | Payer: 59 | Attending: Family Medicine | Admitting: Family Medicine

## 2015-08-31 DIAGNOSIS — M25561 Pain in right knee: Secondary | ICD-10-CM | POA: Diagnosis not present

## 2015-08-31 DIAGNOSIS — S83004S Unspecified dislocation of right patella, sequela: Secondary | ICD-10-CM

## 2015-08-31 DIAGNOSIS — M179 Osteoarthritis of knee, unspecified: Secondary | ICD-10-CM | POA: Diagnosis not present

## 2015-08-31 MED ORDER — IBUPROFEN 600 MG PO TABS: 600.0000 mg | ORAL_TABLET | Freq: Four times a day (QID) | ORAL | 0 refills | Status: DC | PRN

## 2015-08-31 NOTE — ED Triage Notes (Signed)
PT fell last Friday and "twisted her right knee back." PT reports her knee often pops out of place and she is usually able to pop it back in. PT attempted to do so, but knee has been painful and swollen since.

## 2015-08-31 NOTE — ED Provider Notes (Addendum)
[facility]    CSN: [id number] Arrival date & time: 08/31/15  1349  First Provider Contact:  First MD Initiated Contact with Patient 08/31/15 1449        History   Chief Complaint Chief Complaint  Patient presents with  . Knee Injury    HPI Kimberly Kerr. is a 40 y.o. female presenting for right knee pain.   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 She notes it had reduced on its own during the fall. Since that time her knee has continued to be painful and swollen, though swelling has improved. She's been wearing a stabilizing brace and applying ice, but still walking with a limp. 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   HPI  History reviewed. No pertinent past medical history.  There are no active problems to display for this patient.   Past Surgical History:  Procedure Laterality Date  . FOOT SURGERY    . TUBAL LIGATION      OB History    No data available       Home Medications    Prior to Admission medications   Medication Sig Start Date End Date Taking? Authorizing Provider  aspirin 325 MG tablet Take 650 mg by mouth once.    Historical Provider, MD  etonogestrel-ethinyl estradiol (NUVARING) 0.12-0.015 MG/24HR vaginal ring Place 1 each vaginally every 28 (twenty-eight) days. Insert vaginally and leave in place for 3 consecutive weeks, then remove for 1 week.    Historical Provider, MD  ibuprofen (ADVIL,MOTRIN) 600 MG tablet Take 1 tablet (600 mg total) by mouth every 6 (six) hours as needed. 08/31/15   Tyrone Nine, MD  omeprazole (PRILOSEC) 20 MG capsule Take 1 capsule (20 mg total) by mouth daily. 09/15/11 09/14/12  Santiago Glad, PA-C    Family History No family history on file.  Social History Social History  Substance Use Topics  . Smoking status: Never  Smoker  . Smokeless tobacco: Never Used  . Alcohol use Yes     Allergies   Review of patient's allergies indicates no known allergies.   Review of Systems Review of Systems No numbness, tingling, weakness.   Physical Exam Triage Vital Signs ED Triage Vitals  Enc Vitals Group     BP 08/31/15 1437 128/93     Pulse Rate 08/31/15 1437 95     Resp 08/31/15 1437 12     Temp 08/31/15 1437 98.5 F (36.9 C)     Temp Source 08/31/15 1437 Oral     SpO2 08/31/15 1437 100 %     Weight --      Height --      Head Circumference --      Peak Flow --      Pain Score 08/31/15 1457 7     Pain Loc --      Pain Edu? --      Excl. in GC? --    No data found.   Updated Vital Signs BP 128/93 (BP Location: Left Arm)   Pulse 95   Temp 98.5 F (36.9 C) (Oral)   Resp 12   LMP 08/20/2015   SpO2 100%   Physical Exam  Constitutional: She is oriented to person, place, and time. She appears well-developed and well-nourished. No distress.  Eyes: EOM are  normal. Pupils are equal, round, and reactive to light. No scleral icterus.  Neck: Neck supple. No JVD present.  Cardiovascular: Normal rate, regular rhythm, normal heart sounds and intact distal pulses.   No murmur heard. Pulmonary/Chest: Effort normal and breath sounds normal. No respiratory distress.  Abdominal: Soft. Bowel sounds are normal. She exhibits no distension. There is no tenderness.  Musculoskeletal:  Right knee: + effusion without ecchymosis. No bony abnormalities or warmth. + medial joint line tenderness, no patellar tenderness, or condyle tenderness. AROM full in flexion and limited to just before full extension. Ligaments with solid consistent endpoints including ACL, PCL, LCL, MCL. + Thessaly. Patellar glide with crepitus. Patellar and quadriceps tendons unremarkable, strength is normal.    Lymphadenopathy:    She has no cervical adenopathy.  Neurological: She is alert and oriented to person, place, and time. She exhibits  normal muscle tone.  Skin: Skin is warm and dry.  Vitals reviewed.   UC Treatments / Results  Labs (all labs ordered are listed, but only abnormal results are displayed) Labs Reviewed - No data to display  EKG  EKG Interpretation None      Radiology Dg Knee Complete 4 Views Right  Result Date: 08/31/2015 CLINICAL DATA:  RIGHT knee pain for 1 week since falling down stairs, trouble bearing weight, recent patellar dislocation, medial joint line tenderness EXAM: RIGHT KNEE - COMPLETE 4+ VIEW COMPARISON:  07/10/2009 FINDINGS: Low normal osseous mineralization. Medial compartment joint space narrowing. No acute fracture, dislocation or bone destruction. No knee joint effusion. IMPRESSION: Mild degenerative changes. No acute abnormalities. Electronically Signed   By: Ulyses SouthwardMark  Boles M.D.   On: 08/31/2015 15:47    Procedures Procedures (including critical care time)  Medications Ordered in UC Medications - No data to display   Initial Impression / Assessment and Plan / UC Course  I have reviewed the triage vital signs and the nursing notes.  Pertinent labs & imaging results that were available during my care of the patient were reviewed by me and considered in my medical decision making (see chart for details).   Final Clinical Impressions(s) / UC Diagnoses   Final diagnoses:  Patellar dislocation, right, sequela  Knee pain, acute, right  Medial joint line tenderness of knee, right   40 y.o. female with history of right patellar dislocation recently suffered dislocation with ensuring fall down stairs. Pain and swelling have continued longer than previous simple dislocations with medial joint line tenderness. Knee XR (AP/lat/sunrise) showing reduced patella without osteochondral defects. Medial joint space narrowing.  - Rest, ice, compression with brace pt has been using - NSAIDs prn - Refer to sports medicine for further management/rehabilitation  New Prescriptions New  Prescriptions   IBUPROFEN (ADVIL,MOTRIN) 600 MG TABLET    Take 1 tablet (600 mg total) by mouth every 6 (six) hours as needed.     Tyrone Nineyan B Estha Few, MD 08/31/15 1555    Tyrone Nineyan B Hannelore Bova, MD 08/31/15 [phone]

## 2015-09-18 ENCOUNTER — Encounter: Payer: 59 | Admitting: Genetic Counselor

## 2015-09-18 ENCOUNTER — Other Ambulatory Visit: Payer: 59

## 2015-10-22 MED FILL — AMITRIPTYLINE HCL 50 MG TAB: 50 | 30 days supply | Qty: 30 | Fill #4

## 2015-11-25 ENCOUNTER — Other Ambulatory Visit: Payer: Self-pay | Admitting: Internal Medicine

## 2015-11-25 DIAGNOSIS — Z1231 Encounter for screening mammogram for malignant neoplasm of breast: Secondary | ICD-10-CM

## 2015-11-28 DIAGNOSIS — Z0000 Encounter for general adult medical examination without abnormal findings: Secondary | ICD-10-CM | POA: Diagnosis not present

## 2015-11-28 DIAGNOSIS — Z23 Encounter for immunization: Secondary | ICD-10-CM | POA: Diagnosis not present

## 2015-11-28 DIAGNOSIS — G4489 Other headache syndrome: Secondary | ICD-10-CM | POA: Diagnosis not present

## 2015-11-28 DIAGNOSIS — R718 Other abnormality of red blood cells: Secondary | ICD-10-CM | POA: Diagnosis not present

## 2015-11-28 DIAGNOSIS — D649 Anemia, unspecified: Secondary | ICD-10-CM | POA: Diagnosis not present

## 2015-12-24 MED FILL — AMITRIPTYLINE HCL 50 MG TAB: 50 | 30 days supply | Qty: 30 | Fill #0

## 2015-12-31 ENCOUNTER — Ambulatory Visit: Payer: 59

## 2016-01-29 ENCOUNTER — Ambulatory Visit
Admission: RE | Admit: 2016-01-29 | Discharge: 2016-01-29 | Disposition: A | Discharge status: Home / Self Care | Payer: 59 | Source: Ambulatory Visit | Attending: Internal Medicine | Admitting: Internal Medicine

## 2016-01-29 DIAGNOSIS — Z1231 Encounter for screening mammogram for malignant neoplasm of breast: Secondary | ICD-10-CM

## 2016-01-30 ENCOUNTER — Other Ambulatory Visit: Payer: Self-pay | Admitting: Internal Medicine

## 2016-01-30 DIAGNOSIS — R928 Other abnormal and inconclusive findings on diagnostic imaging of breast: Secondary | ICD-10-CM

## 2016-02-04 ENCOUNTER — Other Ambulatory Visit: Payer: 59

## 2016-02-06 ENCOUNTER — Ambulatory Visit
Admission: RE | Admit: 2016-02-06 | Discharge: 2016-02-06 | Disposition: A | Discharge status: Home / Self Care | Payer: 59 | Source: Ambulatory Visit | Attending: Internal Medicine | Admitting: Internal Medicine

## 2016-02-06 ENCOUNTER — Other Ambulatory Visit: Payer: 59

## 2016-02-06 DIAGNOSIS — R928 Other abnormal and inconclusive findings on diagnostic imaging of breast: Secondary | ICD-10-CM

## 2016-02-06 DIAGNOSIS — R922 Inconclusive mammogram: Secondary | ICD-10-CM | POA: Diagnosis not present

## 2016-03-10 MED FILL — AMITRIPTYLINE HCL 50 MG TAB: 50 | 30 days supply | Qty: 30 | Fill #1

## 2016-05-14 MED FILL — AMITRIPTYLINE HCL 50 MG TAB: 50 | 30 days supply | Qty: 30 | Fill #2

## 2016-05-19 ENCOUNTER — Ambulatory Visit: Payer: 59

## 2016-05-19 ENCOUNTER — Ambulatory Visit (INDEPENDENT_AMBULATORY_CARE_PROVIDER_SITE_OTHER): Payer: 59 | Admitting: Podiatry

## 2016-05-19 DIAGNOSIS — Q828 Other specified congenital malformations of skin: Secondary | ICD-10-CM | POA: Diagnosis not present

## 2016-05-19 DIAGNOSIS — M779 Enthesopathy, unspecified: Secondary | ICD-10-CM | POA: Insufficient documentation

## 2016-05-19 DIAGNOSIS — L603 Nail dystrophy: Secondary | ICD-10-CM

## 2016-05-20 NOTE — Progress Notes (Signed)
She presents today with a chief complaint of painful wart to the plantar aspect of the right foot. Stasis then there for about a month she's tried nothing to treat it. She doesn't recall any injury or any foreign body.  Objective: I reviewed her past medical history medications allergies surgery social history and review of systems area pulses are strongly palpable. Neurologic sensorium is intact. Deep tendon reflexes are intact. Muscle strength +5 over 5 dorsiflexion plantar flexors and inverters everters all intrinsic musculature is intact. Orthopedic demonstrates all joints to ankle range of motion without crepitation. No obvious abnormalities.  Cutaneous evaluation does demonstrates a well-hydrated cutis she does have a solitary porokeratotic lesion no verrucoid lesion. This lesion lies beneath the second metatarsal phalangeal joint of the right foot. Mother tender on direct palpation.  Assessment: Porokeratosis subsecond metatarsophalangeal joint right foot.  Plan: I debrided the area thoroughly today without Bleeding Pl., Cantharone under occlusion to be washed out thoroughly tomorrow follow up with her in 6 weeks for retreat.

## 2016-06-30 ENCOUNTER — Ambulatory Visit: Payer: 59 | Admitting: Podiatry

## 2016-07-12 MED FILL — AMITRIPTYLINE HCL 50 MG TAB: 50 | 30 days supply | Qty: 30 | Fill #3

## 2016-07-13 DIAGNOSIS — N760 Acute vaginitis: Secondary | ICD-10-CM | POA: Diagnosis not present

## 2016-07-13 DIAGNOSIS — Z01411 Encounter for gynecological examination (general) (routine) with abnormal findings: Secondary | ICD-10-CM | POA: Diagnosis not present

## 2016-09-09 MED FILL — AMITRIPTYLINE HCL 50 MG TAB: 50 | 30 days supply | Qty: 30 | Fill #4

## 2016-09-24 ENCOUNTER — Ambulatory Visit (HOSPITAL_COMMUNITY)
Admission: EM | Admit: 2016-09-24 | Discharge: 2016-09-24 | Disposition: A | Discharge status: Other Acute Inpatient Hospital | Payer: 59

## 2016-10-25 MED FILL — AMITRIPTYLINE HCL 50 MG TAB: 50 | 30 days supply | Qty: 30 | Fill #5

## 2017-01-07 MED FILL — AMITRIPTYLINE HCL 50 MG TAB: 50 | 30 days supply | Qty: 30 | Fill #0

## 2017-01-10 DIAGNOSIS — R51 Headache: Secondary | ICD-10-CM | POA: Diagnosis not present

## 2017-01-10 DIAGNOSIS — Z0000 Encounter for general adult medical examination without abnormal findings: Secondary | ICD-10-CM | POA: Diagnosis not present

## 2017-01-10 DIAGNOSIS — R030 Elevated blood-pressure reading, without diagnosis of hypertension: Secondary | ICD-10-CM | POA: Diagnosis not present

## 2017-01-10 DIAGNOSIS — K5900 Constipation, unspecified: Secondary | ICD-10-CM | POA: Diagnosis not present

## 2017-01-10 DIAGNOSIS — R29898 Other symptoms and signs involving the musculoskeletal system: Secondary | ICD-10-CM | POA: Diagnosis not present

## 2017-02-25 MED FILL — AMITRIPTYLINE HCL 50 MG TAB: 50 | 90 days supply | Qty: 90 | Fill #0

## 2017-05-13 ENCOUNTER — Other Ambulatory Visit: Payer: Self-pay | Admitting: Internal Medicine

## 2017-05-13 DIAGNOSIS — Z1231 Encounter for screening mammogram for malignant neoplasm of breast: Secondary | ICD-10-CM

## 2017-05-20 DIAGNOSIS — R102 Pelvic and perineal pain: Secondary | ICD-10-CM | POA: Diagnosis not present

## 2017-06-01 DIAGNOSIS — N949 Unspecified condition associated with female genital organs and menstrual cycle: Secondary | ICD-10-CM | POA: Diagnosis not present

## 2017-06-01 DIAGNOSIS — R102 Pelvic and perineal pain: Secondary | ICD-10-CM | POA: Diagnosis not present

## 2017-06-02 MED FILL — PEG-3350 SOLUTION: 420 | 2 days supply | Qty: 4000 | Fill #0

## 2017-06-03 DIAGNOSIS — Z1211 Encounter for screening for malignant neoplasm of colon: Secondary | ICD-10-CM | POA: Diagnosis not present

## 2017-06-08 ENCOUNTER — Ambulatory Visit
Admission: RE | Admit: 2017-06-08 | Discharge: 2017-06-08 | Disposition: A | Discharge status: Home / Self Care | Payer: 59 | Source: Ambulatory Visit | Attending: Internal Medicine | Admitting: Internal Medicine

## 2017-06-08 DIAGNOSIS — Z1231 Encounter for screening mammogram for malignant neoplasm of breast: Secondary | ICD-10-CM

## 2017-08-04 ENCOUNTER — Other Ambulatory Visit: Payer: Self-pay | Admitting: Obstetrics and Gynecology

## 2017-08-04 ENCOUNTER — Other Ambulatory Visit (HOSPITAL_COMMUNITY)
Admission: RE | Admit: 2017-08-04 | Discharge: 2017-08-04 | Disposition: A | Discharge status: Home / Self Care | Payer: 59 | Source: Ambulatory Visit | Attending: Obstetrics and Gynecology | Admitting: Obstetrics and Gynecology

## 2017-08-04 DIAGNOSIS — Z01411 Encounter for gynecological examination (general) (routine) with abnormal findings: Secondary | ICD-10-CM | POA: Insufficient documentation

## 2017-08-04 DIAGNOSIS — Z01419 Encounter for gynecological examination (general) (routine) without abnormal findings: Secondary | ICD-10-CM | POA: Diagnosis not present

## 2017-08-05 LAB — CYTOLOGY - PAP
Chlamydia: NEGATIVE
DIAGNOSIS: NEGATIVE
HPV (WINDOPATH): NOT DETECTED
Neisseria Gonorrhea: NEGATIVE

## 2017-09-08 MED FILL — AMITRIPTYLINE HCL 50 MG TAB: 50 | 90 days supply | Qty: 90 | Fill #1

## 2018-02-13 DIAGNOSIS — H5213 Myopia, bilateral: Secondary | ICD-10-CM | POA: Diagnosis not present

## 2018-02-13 DIAGNOSIS — H524 Presbyopia: Secondary | ICD-10-CM | POA: Diagnosis not present

## 2018-03-10 DIAGNOSIS — G4489 Other headache syndrome: Secondary | ICD-10-CM | POA: Diagnosis not present

## 2018-03-10 DIAGNOSIS — I10 Essential (primary) hypertension: Secondary | ICD-10-CM | POA: Diagnosis not present

## 2018-03-10 DIAGNOSIS — E669 Obesity, unspecified: Secondary | ICD-10-CM | POA: Diagnosis not present

## 2018-03-10 DIAGNOSIS — Z6830 Body mass index (BMI) 30.0-30.9, adult: Secondary | ICD-10-CM | POA: Diagnosis not present

## 2018-03-10 DIAGNOSIS — Z0000 Encounter for general adult medical examination without abnormal findings: Secondary | ICD-10-CM | POA: Diagnosis not present

## 2018-03-10 MED FILL — AMLODIPINE BESYLATE 5 MG TA: 5 | 30 days supply | Qty: 30 | Fill #0

## 2018-03-13 ENCOUNTER — Other Ambulatory Visit: Payer: Self-pay | Admitting: Internal Medicine

## 2018-03-13 DIAGNOSIS — G4489 Other headache syndrome: Secondary | ICD-10-CM

## 2018-03-21 ENCOUNTER — Other Ambulatory Visit: Payer: 59

## 2018-03-22 MED FILL — AMITRIPTYLINE HCL 50 MG TAB: 50 | 90 days supply | Qty: 90 | Fill #0

## 2018-04-24 DIAGNOSIS — I10 Essential (primary) hypertension: Secondary | ICD-10-CM | POA: Diagnosis not present

## 2018-04-24 MED FILL — AMLODIPINE BESYLATE 10 MG T: 10 | 30 days supply | Qty: 30 | Fill #0

## 2018-05-02 ENCOUNTER — Encounter (HOSPITAL_COMMUNITY): Payer: Self-pay | Admitting: Emergency Medicine

## 2018-05-02 ENCOUNTER — Ambulatory Visit (INDEPENDENT_AMBULATORY_CARE_PROVIDER_SITE_OTHER): Payer: 59

## 2018-05-02 ENCOUNTER — Ambulatory Visit (HOSPITAL_COMMUNITY)
Admission: EM | Admit: 2018-05-02 | Discharge: 2018-05-02 | Disposition: A | Discharge status: Home / Self Care | Payer: 59 | Attending: Family Medicine | Admitting: Family Medicine

## 2018-05-02 DIAGNOSIS — S8991XA Unspecified injury of right lower leg, initial encounter: Secondary | ICD-10-CM

## 2018-05-02 DIAGNOSIS — M25561 Pain in right knee: Secondary | ICD-10-CM | POA: Diagnosis not present

## 2018-05-02 DIAGNOSIS — S83011A Lateral subluxation of right patella, initial encounter: Secondary | ICD-10-CM

## 2018-05-02 MED ORDER — NAPROXEN 500 MG PO TABS: 500.0000 mg | ORAL_TABLET | Freq: Two times a day (BID) | ORAL | 0 refills | Status: DC

## 2018-05-02 MED FILL — NAPROXEN 500 MG TABLET: 500 | 15 days supply | Qty: 30 | Fill #0

## 2018-05-02 NOTE — Discharge Instructions (Signed)
Take the naproxen 2 x a day with food Limit walking Wear brace while up Ice to area 20 min every couple of hours Follow up with orthopedics

## 2018-05-02 NOTE — ED Provider Notes (Signed)
[facility]    CSN: [id number] Arrival date & time: 05/02/18  1305     History   Chief Complaint Chief Complaint  Patient presents with  . Knee Pain    HPI Kimberly Tersa Schroff. is a 43 y.o. female.   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  Both knees. This time 5 d ago she was running to the car in the rain and she felt a sudden pain and instability on the right.  Had to sit down.  It popped back into place on its own.  Ever since she has had pain and swelling.  More than usual.  Can partial weight bear but is limping and cautious Has never seen ortho Has never been to PT Has a brace at home History reviewed. No pertinent past medical history.  Patient Active Problem List   Diagnosis Date Noted  . Capsulitis 05/19/2016    Past Surgical History:  Procedure Laterality Date  . FOOT SURGERY    . TUBAL LIGATION      OB History   No obstetric history on file.      Home Medications    Prior to Admission medications   Medication Sig Start Date End Date Taking? Authorizing Provider  amitriptyline (ELAVIL) 50 MG tablet  05/14/16   [provider]  amLODipine (NORVASC) 10 MG tablet  04/24/18   [provider]  naproxen (NAPROSYN) 500 MG tablet Take 1 tablet (500 mg total) by mouth 2 (two) times daily. 05/02/18   Eustace Moore, MD    Family History No family history on file.  Social History Social History   Tobacco Use  . Smoking status: Never Smoker  . Smokeless tobacco: Never Used  Substance Use Topics  . Alcohol use: Yes  . Drug use: No     Allergies   Patient has no known allergies.   Review of Systems Review of Systems  Constitutional: Negative for chills and fever.  HENT: Negative for ear pain and sore throat.   Eyes: Negative for pain and visual disturbance.  Respiratory: Negative for cough and shortness of breath.   Cardiovascular: Negative for chest pain and palpitations.   Gastrointestinal: Negative for abdominal pain and vomiting.  Genitourinary: Negative for dysuria and hematuria.  Musculoskeletal: Positive for arthralgias and gait problem. Negative for back pain.  Skin: Negative for color change and rash.  Neurological: Negative for seizures and syncope.  All other systems reviewed and are negative.    Physical Exam Triage Vital Signs ED Triage Vitals  Enc Vitals Group     BP 05/02/18 1409 138/88     Pulse Rate 05/02/18 1409 89     Resp 05/02/18 1409 18     Temp 05/02/18 1409 98.4 F (36.9 C)     Temp Source 05/02/18 1409 Oral     SpO2 05/02/18 1409 99 %     Weight --      Height --      Head Circumference --      Peak Flow --      Pain Score 05/02/18 1411 8     Pain Loc --      Pain Edu? --      Excl. in GC? --    No data found.  Updated Vital Signs BP 138/88 (BP Location: Right Arm)   Pulse 89   Temp 98.4 F (36.9 C) (Oral)   Resp 18   LMP 04/01/2018  SpO2 99%   Visual Acuity Right Eye Distance:   Left Eye Distance:   Bilateral Distance:    Right Eye Near:   Left Eye Near:    Bilateral Near:     Physical Exam Constitutional:      General: She is not in acute distress.    Appearance: She is well-developed and normal weight.     Comments: Antalgic gait  HENT:     Head: Normocephalic and atraumatic.  Eyes:     Conjunctiva/sclera: Conjunctivae normal.     Pupils: Pupils are equal, round, and reactive to light.  Neck:     Musculoskeletal: Normal range of motion.  Cardiovascular:     Rate and Rhythm: Normal rate.     Heart sounds: Normal heart sounds.  Pulmonary:     Effort: Pulmonary effort is normal. No respiratory distress.  Abdominal:     General: There is no distension.     Palpations: Abdomen is soft.  Musculoskeletal: Normal range of motion.     Comments: Right knee with warmth and slight effusion.  Tender medial patella .  Lacks full flex and extension by about 10%.  Pain with patella manipulation  Skin:     General: Skin is warm and dry.  Neurological:     Mental Status: She is alert.      UC Treatments / Results  Labs (all labs ordered are listed, but only abnormal results are displayed) Labs Reviewed - No data to display  EKG None  Radiology Dg Knee Complete 4 Views Right  Result Date: 05/02/2018 CLINICAL DATA:  Pain following fall EXAM: RIGHT KNEE - COMPLETE 4+ VIEW COMPARISON:  August 31, 2015 FINDINGS: Frontal, lateral, and bilateral oblique views were obtained. There is a small focus of calcification medial to the distal femoral condyle, a potential small avulsion injury. No other findings suggesting fracture. No dislocation. There is mild lateral patellar subluxation. There is no appreciable joint effusion. There is no evident joint space narrowing or erosion. IMPRESSION: Suspected small avulsion along the medial distal femoral condyle. Lateral patellar subluxation without dislocation. No appreciable joint effusion. No appreciable arthropathy. These results will be called to the ordering clinician or representative by the Radiologist Assistant, and communication documented in the PACS or zVision Dashboard. Electronically Signed   By: Bretta BangWilliam  Woodruff III M.D.   On: 05/02/2018 14:40    Procedures Procedures (including critical care time)  Medications Ordered in UC Medications - No data to display  Initial Impression / Assessment and Plan / UC Course  I have reviewed the triage vital signs and the nursing notes.  Pertinent labs & imaging results that were available during my care of the patient were reviewed by me and considered in my medical decision making (see chart for details).     Discussed x ray with patient.  Needs orthopedic consult.  Final Clinical Impressions(s) / UC Diagnoses   Final diagnoses:  Lateral subluxation of patella, right, initial encounter     Discharge Instructions     Take the naproxen 2 x a day with food Limit walking Wear brace while up  Ice to area 20 min every couple of hours Follow up with orthopedics   ED Prescriptions    Medication Sig Dispense Auth. Provider   naproxen (NAPROSYN) 500 MG tablet Take 1 tablet (500 mg total) by mouth 2 (two) times daily. 30 tablet Eustace MooreNelson, Yvonne Sue, MD     Controlled Substance Prescriptions [place] Controlled Substance Registry consulted? Not Applicable  Eustace Moore, MD 05/02/18 [phone]

## 2018-05-02 NOTE — ED Triage Notes (Signed)
Pt states a week ago she was running and tripped and twisted her R knee states it feels like something is popped out of place. Denies falling.

## 2018-05-04 DIAGNOSIS — M25561 Pain in right knee: Secondary | ICD-10-CM | POA: Insufficient documentation

## 2018-05-08 DIAGNOSIS — M25561 Pain in right knee: Secondary | ICD-10-CM | POA: Diagnosis not present

## 2018-05-08 DIAGNOSIS — M25361 Other instability, right knee: Secondary | ICD-10-CM | POA: Diagnosis not present

## 2018-05-23 DIAGNOSIS — I10 Essential (primary) hypertension: Secondary | ICD-10-CM | POA: Diagnosis not present

## 2018-06-06 MED FILL — FLUCONAZOLE 150 MG TABS: 150 | 3 days supply | Qty: 2 | Fill #0

## 2018-06-07 DIAGNOSIS — M25561 Pain in right knee: Secondary | ICD-10-CM | POA: Diagnosis not present

## 2018-06-07 DIAGNOSIS — M25361 Other instability, right knee: Secondary | ICD-10-CM | POA: Diagnosis not present

## 2018-06-11 DIAGNOSIS — M25369 Other instability, unspecified knee: Secondary | ICD-10-CM | POA: Insufficient documentation

## 2018-06-15 DIAGNOSIS — M25561 Pain in right knee: Secondary | ICD-10-CM | POA: Diagnosis not present

## 2018-06-22 MED FILL — AMLODIPINE BESYLATE 10 MG T: 10 | 30 days supply | Qty: 30 | Fill #1

## 2018-06-26 DIAGNOSIS — Q6650 Congenital pes planus, unspecified foot: Secondary | ICD-10-CM | POA: Diagnosis not present

## 2018-06-26 DIAGNOSIS — S83004A Unspecified dislocation of right patella, initial encounter: Secondary | ICD-10-CM | POA: Diagnosis not present

## 2018-08-03 DIAGNOSIS — M179 Osteoarthritis of knee, unspecified: Secondary | ICD-10-CM | POA: Insufficient documentation

## 2018-08-07 DIAGNOSIS — Z01419 Encounter for gynecological examination (general) (routine) without abnormal findings: Secondary | ICD-10-CM | POA: Diagnosis not present

## 2018-08-07 DIAGNOSIS — Z113 Encounter for screening for infections with a predominantly sexual mode of transmission: Secondary | ICD-10-CM | POA: Diagnosis not present

## 2018-08-07 DIAGNOSIS — A6000 Herpesviral infection of urogenital system, unspecified: Secondary | ICD-10-CM | POA: Diagnosis not present

## 2018-08-07 DIAGNOSIS — N760 Acute vaginitis: Secondary | ICD-10-CM | POA: Diagnosis not present

## 2018-08-07 DIAGNOSIS — R102 Pelvic and perineal pain: Secondary | ICD-10-CM | POA: Diagnosis not present

## 2018-08-07 DIAGNOSIS — N898 Other specified noninflammatory disorders of vagina: Secondary | ICD-10-CM | POA: Diagnosis not present

## 2018-08-07 MED FILL — VALACYCLOVIR HCL 500 MG TAB: 500 | 15 days supply | Qty: 30 | Fill #0

## 2018-08-07 MED FILL — METRONIDAZOLE 500 MG TABS: 500 | 7 days supply | Qty: 14 | Fill #0

## 2018-10-25 MED FILL — AMITRIPTYLINE HCL 50 MG TAB: 50 | 90 days supply | Qty: 90 | Fill #1

## 2018-10-25 MED FILL — AMLODIPINE BESYLATE 10 MG T: 10 | 30 days supply | Qty: 30 | Fill #2

## 2018-11-14 DIAGNOSIS — Z20828 Contact with and (suspected) exposure to other viral communicable diseases: Secondary | ICD-10-CM | POA: Diagnosis not present

## 2019-01-29 ENCOUNTER — Other Ambulatory Visit: Payer: Self-pay | Admitting: Internal Medicine

## 2019-01-29 DIAGNOSIS — Z1231 Encounter for screening mammogram for malignant neoplasm of breast: Secondary | ICD-10-CM

## 2019-02-06 ENCOUNTER — Other Ambulatory Visit: Payer: Self-pay

## 2019-02-06 ENCOUNTER — Ambulatory Visit
Admission: RE | Admit: 2019-02-06 | Discharge: 2019-02-06 | Disposition: A | Discharge status: Home / Self Care | Payer: 59 | Source: Ambulatory Visit | Attending: Internal Medicine | Admitting: Internal Medicine

## 2019-02-06 DIAGNOSIS — Z1231 Encounter for screening mammogram for malignant neoplasm of breast: Secondary | ICD-10-CM | POA: Diagnosis not present

## 2019-02-20 ENCOUNTER — Ambulatory Visit
Admission: RE | Admit: 2019-02-20 | Discharge: 2019-02-20 | Disposition: A | Discharge status: Home / Self Care | Payer: 59 | Source: Ambulatory Visit | Attending: Internal Medicine | Admitting: Internal Medicine

## 2019-02-20 DIAGNOSIS — G8929 Other chronic pain: Secondary | ICD-10-CM | POA: Diagnosis not present

## 2019-02-20 DIAGNOSIS — G4489 Other headache syndrome: Secondary | ICD-10-CM

## 2019-02-20 MED ORDER — GADOBENATE DIMEGLUMINE 529 MG/ML IV SOLN
15.0000 mL | Freq: Once | INTRAVENOUS | Status: AC | PRN
  Administered 2019-02-20: 15 mL via INTRAVENOUS

## 2019-03-21 MED FILL — AMLODIPINE BESYLATE 10 MG T: 10 | 30 days supply | Qty: 30 | Fill #3

## 2019-03-21 MED FILL — AMITRIPTYLINE HCL 50 MG TAB: 50 | 90 days supply | Qty: 90 | Fill #0

## 2019-04-10 DIAGNOSIS — Z0000 Encounter for general adult medical examination without abnormal findings: Secondary | ICD-10-CM | POA: Diagnosis not present

## 2019-04-10 DIAGNOSIS — H9312 Tinnitus, left ear: Secondary | ICD-10-CM | POA: Diagnosis not present

## 2019-04-10 DIAGNOSIS — I10 Essential (primary) hypertension: Secondary | ICD-10-CM | POA: Diagnosis not present

## 2019-04-10 DIAGNOSIS — G44209 Tension-type headache, unspecified, not intractable: Secondary | ICD-10-CM | POA: Diagnosis not present

## 2019-04-24 MED FILL — FLUCONAZOLE 150 MG TABS: 150 | 3 days supply | Qty: 2 | Fill #1

## 2019-06-04 DIAGNOSIS — K644 Residual hemorrhoidal skin tags: Secondary | ICD-10-CM | POA: Diagnosis not present

## 2019-07-17 DIAGNOSIS — L219 Seborrheic dermatitis, unspecified: Secondary | ICD-10-CM | POA: Diagnosis not present

## 2019-07-17 MED FILL — CLOBETASOL PROPIONATE 0.05: 0.05 | 30 days supply | Qty: 50 | Fill #0

## 2019-07-17 MED FILL — KETOCONAZOLE 2% SHAMPOO: 2 | 30 days supply | Qty: 120 | Fill #0

## 2019-08-03 DIAGNOSIS — R52 Pain, unspecified: Secondary | ICD-10-CM | POA: Diagnosis not present

## 2019-08-03 DIAGNOSIS — R030 Elevated blood-pressure reading, without diagnosis of hypertension: Secondary | ICD-10-CM | POA: Diagnosis not present

## 2019-08-03 MED FILL — GABAPENTIN 100 MG CAP: 100 | 30 days supply | Qty: 90 | Fill #0

## 2019-08-06 ENCOUNTER — Other Ambulatory Visit (HOSPITAL_COMMUNITY): Payer: Self-pay | Admitting: Internal Medicine

## 2019-08-06 MED FILL — AMLODIPINE BESYLATE 10 MG T: 10 | 30 days supply | Qty: 30 | Fill #0

## 2019-08-10 MED FILL — CLOBETASOL 0.05% SOLUTION: 0.05 | 30 days supply | Qty: 50 | Fill #0

## 2019-09-04 DIAGNOSIS — I10 Essential (primary) hypertension: Secondary | ICD-10-CM | POA: Diagnosis not present

## 2019-09-04 DIAGNOSIS — R202 Paresthesia of skin: Secondary | ICD-10-CM | POA: Diagnosis not present

## 2019-09-11 MED FILL — CLOBETASOL 0.05% SOLUTION: 0.05 | 30 days supply | Qty: 50 | Fill #0

## 2019-10-09 MED FILL — AMLODIPINE BESYLATE 10 MG T: 10 | 30 days supply | Qty: 30 | Fill #1

## 2019-10-16 ENCOUNTER — Ambulatory Visit: Payer: 59 | Admitting: Neurology

## 2019-12-17 MED FILL — AMITRIPTYLINE HCL 50 MG TAB: 50 | 90 days supply | Qty: 90 | Fill #1

## 2019-12-17 MED FILL — AMLODIPINE BESYLATE 10 MG T: 10 | 30 days supply | Qty: 30 | Fill #2

## 2020-01-29 ENCOUNTER — Other Ambulatory Visit (HOSPITAL_COMMUNITY): Payer: Self-pay | Admitting: Nurse Practitioner

## 2020-01-29 DIAGNOSIS — Z01419 Encounter for gynecological examination (general) (routine) without abnormal findings: Secondary | ICD-10-CM | POA: Diagnosis not present

## 2020-01-29 DIAGNOSIS — N898 Other specified noninflammatory disorders of vagina: Secondary | ICD-10-CM | POA: Diagnosis not present

## 2020-01-29 DIAGNOSIS — Z113 Encounter for screening for infections with a predominantly sexual mode of transmission: Secondary | ICD-10-CM | POA: Diagnosis not present

## 2020-01-29 MED FILL — METRONIDAZOLE 500 MG TABS: 500 | 7 days supply | Qty: 14 | Fill #0

## 2020-03-27 ENCOUNTER — Other Ambulatory Visit (HOSPITAL_COMMUNITY): Payer: Self-pay | Admitting: Internal Medicine

## 2020-03-27 MED FILL — AMITRIPTYLINE HCL 50 MG TAB: 50 | 90 days supply | Qty: 90 | Fill #0

## 2020-03-27 MED FILL — AMLODIPINE BESYLATE 10 MG T: 10 | 30 days supply | Qty: 30 | Fill #3

## 2020-07-03 ENCOUNTER — Other Ambulatory Visit (HOSPITAL_COMMUNITY): Payer: Self-pay

## 2020-07-05 ENCOUNTER — Other Ambulatory Visit (HOSPITAL_COMMUNITY): Payer: Self-pay

## 2020-07-07 ENCOUNTER — Other Ambulatory Visit (HOSPITAL_COMMUNITY): Payer: Self-pay

## 2020-07-08 ENCOUNTER — Other Ambulatory Visit (HOSPITAL_COMMUNITY): Payer: Self-pay

## 2020-07-09 ENCOUNTER — Other Ambulatory Visit (HOSPITAL_COMMUNITY): Payer: Self-pay

## 2020-07-14 ENCOUNTER — Other Ambulatory Visit (HOSPITAL_COMMUNITY): Payer: Self-pay

## 2020-07-14 DIAGNOSIS — I10 Essential (primary) hypertension: Secondary | ICD-10-CM | POA: Diagnosis not present

## 2020-07-14 DIAGNOSIS — Z113 Encounter for screening for infections with a predominantly sexual mode of transmission: Secondary | ICD-10-CM | POA: Diagnosis not present

## 2020-07-14 DIAGNOSIS — Z7184 Encounter for health counseling related to travel: Secondary | ICD-10-CM | POA: Diagnosis not present

## 2020-07-14 DIAGNOSIS — Z0000 Encounter for general adult medical examination without abnormal findings: Secondary | ICD-10-CM | POA: Diagnosis not present

## 2020-07-14 DIAGNOSIS — R300 Dysuria: Secondary | ICD-10-CM | POA: Diagnosis not present

## 2020-07-14 DIAGNOSIS — Z23 Encounter for immunization: Secondary | ICD-10-CM | POA: Diagnosis not present

## 2020-07-14 MED ORDER — ATOVAQUONE-PROGUANIL HCL 250-100 MG PO TABS
ORAL_TABLET | ORAL | 0 refills | Status: DC
  Filled 2020-07-14: qty 13, 13d supply, fill #0

## 2020-07-14 MED ORDER — SCOPOLAMINE 1 MG/3DAYS TD PT72
MEDICATED_PATCH | TRANSDERMAL | 0 refills | Status: DC
  Filled 2020-07-14: qty 2, 6d supply, fill #0

## 2020-07-15 ENCOUNTER — Other Ambulatory Visit (HOSPITAL_COMMUNITY): Payer: Self-pay

## 2020-07-16 ENCOUNTER — Other Ambulatory Visit (HOSPITAL_COMMUNITY): Payer: Self-pay

## 2020-07-16 MED ORDER — ALPRAZOLAM 0.25 MG PO TABS
ORAL_TABLET | ORAL | 0 refills | Status: AC
  Filled 2020-07-16: qty 4, 2d supply, fill #0

## 2020-07-16 MED ORDER — METRONIDAZOLE 500 MG PO TABS
500.0000 mg | ORAL_TABLET | Freq: Two times a day (BID) | ORAL | 0 refills | Status: DC
  Filled 2020-07-16: qty 14, 7d supply, fill #0

## 2020-07-17 ENCOUNTER — Other Ambulatory Visit (HOSPITAL_COMMUNITY): Payer: Self-pay

## 2020-08-05 ENCOUNTER — Other Ambulatory Visit (HOSPITAL_COMMUNITY): Payer: Self-pay

## 2020-08-05 MED FILL — Amlodipine Besylate Tab 10 MG (Base Equivalent): ORAL | 30 days supply | Qty: 30 | Fill #0 | Status: AC

## 2020-10-30 ENCOUNTER — Other Ambulatory Visit: Payer: Self-pay | Admitting: Internal Medicine

## 2020-10-30 DIAGNOSIS — Z1231 Encounter for screening mammogram for malignant neoplasm of breast: Secondary | ICD-10-CM

## 2020-10-30 IMAGING — MR MR HEAD WO/W CM
10 series · 48 of 48 positions shown · IV contrast (multihance)
Comparison: Head CT 09/17/2013.

CLINICAL DATA: 43-year-old female with chronic pain at the vertex.
Symptoms for 4 years with no related injury.

Creatinine was obtained on site at [HOSPITAL] at [HOSPITAL].
Results: Creatinine 0.9 mg/dL.
EXAM:
MRI HEAD WITHOUT AND WITH CONTRAST
TECHNIQUE: Multiplanar, multiecho pulse sequences of the brain and surrounding
structures were obtained without and with intravenous contrast.
CONTRAST:  15mL MULTIHANCE GADOBENATE DIMEGLUMINE 529 MG/ML IV SOLN

[Series 5: T1 · sagittal · 4.0mm · 0.75mm/px · 3 of 28 slices shown (1 of 3)]
[im 1/28]
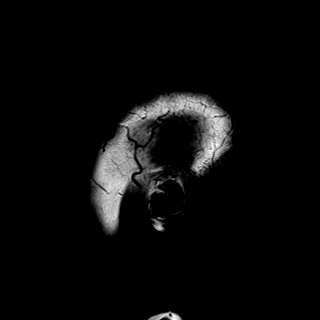
[im 14/28]
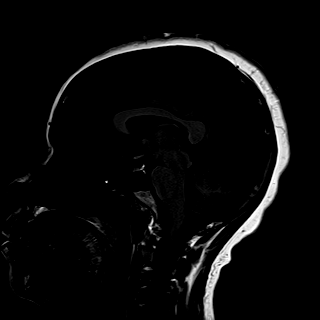
[im 28/28]
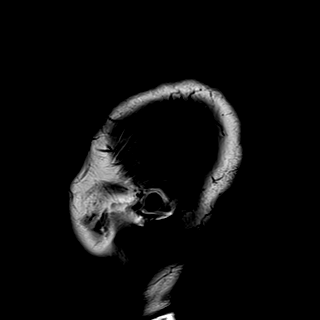

[Series 6: DWI · axial · 3.0mm · 1.44mm/px · z∈[-67,+65]mm · 6 of 84 slices shown (1 of 2)]
[im 1/84]
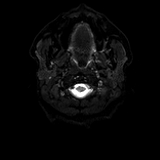
[im 17/84]
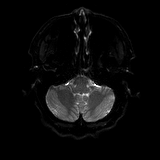
[im 34/84]
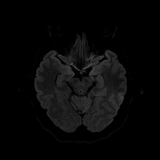
[im 50/84]
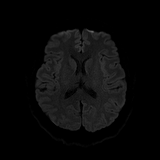
[im 67/84]
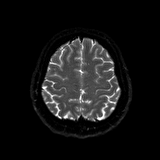
[im 84/84]
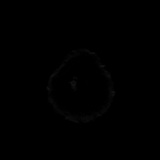

[Series 7: DWI · axial · 3.0mm · 1.44mm/px · z∈[-67,+65]mm · 3 of 42 slices shown (2 of 2)]
[im 1/42]
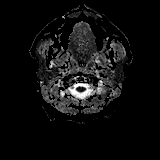
[im 21/42]
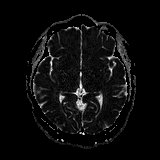
[im 42/42]
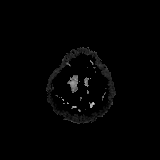

[Series 8: T2 · axial · 4.0mm · 0.36mm/px · z∈[-68,+64]mm · 2 of 27 slices shown]
[im 1/27]
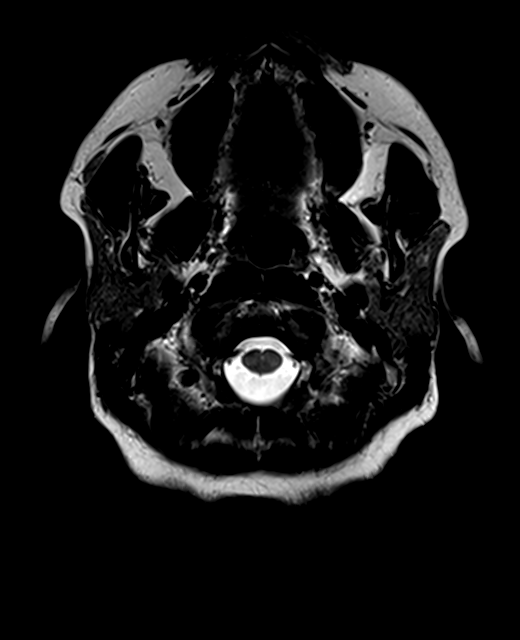
[im 27/27]
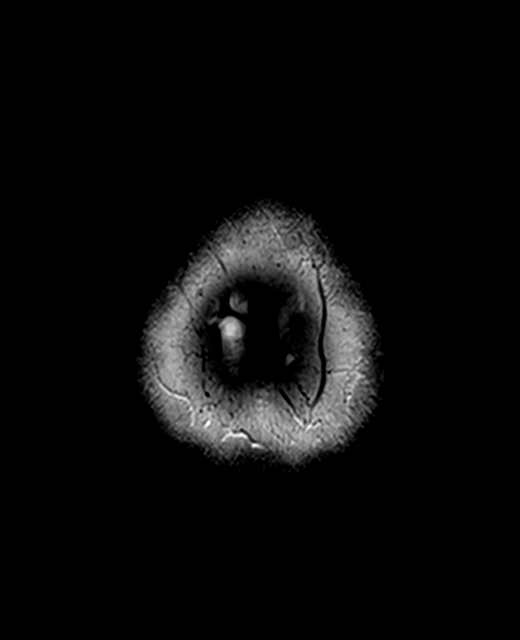

[Series 9: FLAIR · axial · 3.0mm · 0.72mm/px · z∈[-67,+65]mm · 2 of 26 slices shown]
[im 1/26]
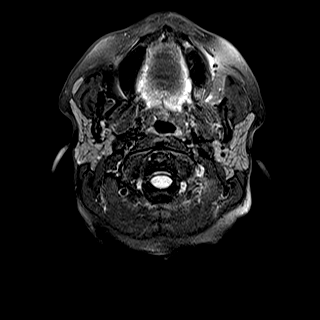
[im 26/26]
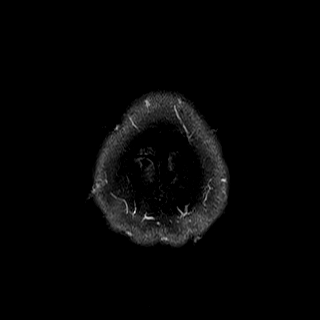

[Series 11: swi_images · axial · 2.0mm · 0.90mm/px · z∈[-72,+68]mm · 6 of 72 slices shown]
[im 1/72]
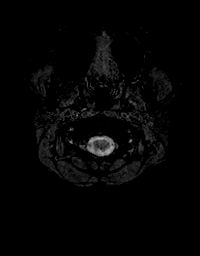
[im 15/72]
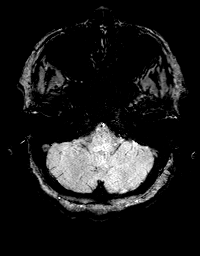
[im 29/72]
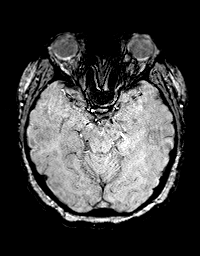
[im 43/72]
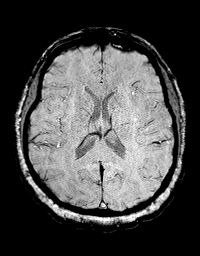
[im 57/72]
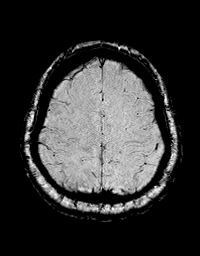
[im 72/72]
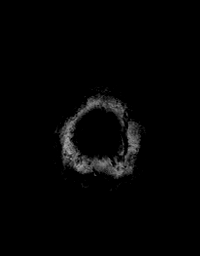

[Series 12: T1 · axial · 1.0mm · 0.94mm/px · z∈[-73,+67]mm · 11 of 144 slices shown (2 of 3)]
[im 1/144]
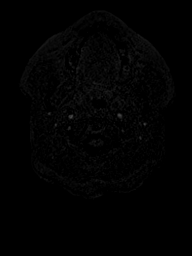
[im 15/144]
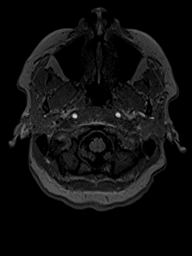
[im 29/144]
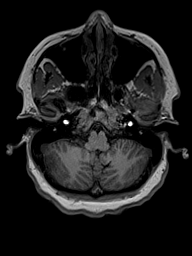
[im 43/144]
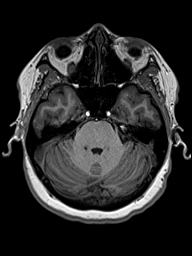
[im 58/144]
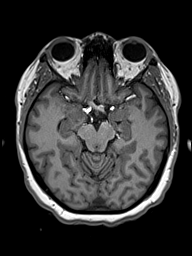
[im 72/144]
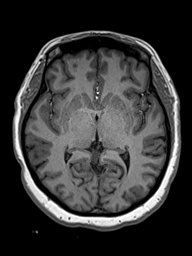
[im 86/144]
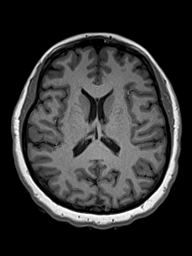
[im 101/144]
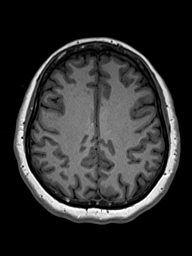
[im 115/144]
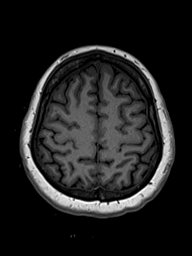
[im 129/144]
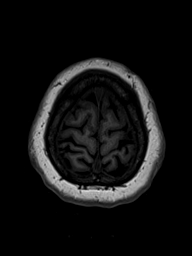
[im 144/144]
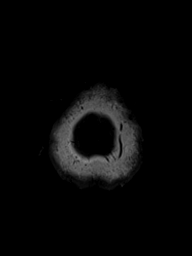

[Series 13: T2 post-contrast · coronal · 4.0mm · 0.36mm/px · 2 of 29 slices shown]
[im 1/29]
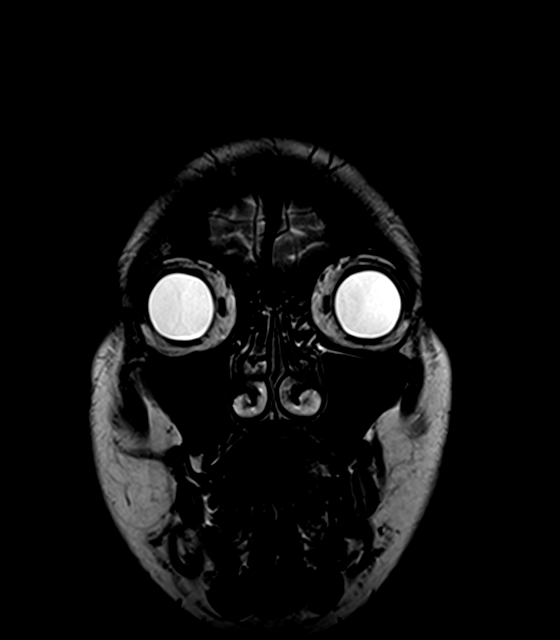
[im 29/29]
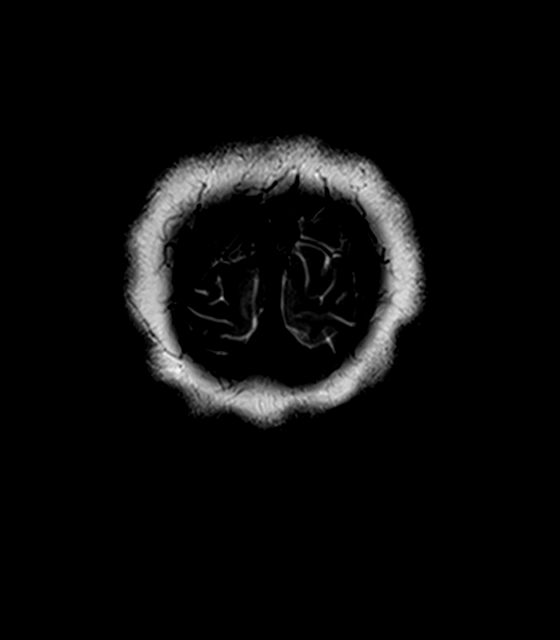

[Series 14: T1 · axial · 1.0mm · 0.94mm/px · z∈[-73,+67]mm · 11 of 144 slices shown (3 of 3)]
[im 1/144]
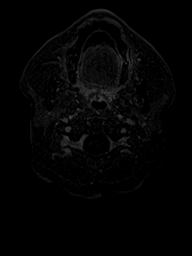
[im 15/144]
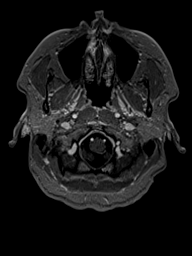
[im 29/144]
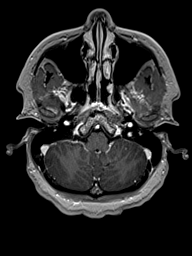
[im 43/144]
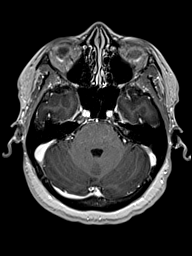
[im 58/144]
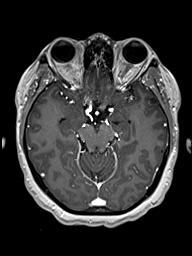
[im 72/144]
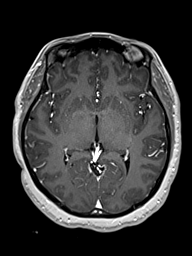
[im 86/144]
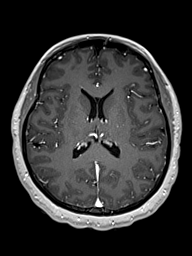
[im 101/144]
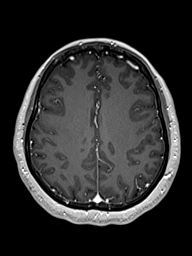
[im 115/144]
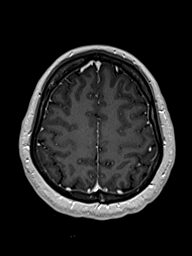
[im 129/144]
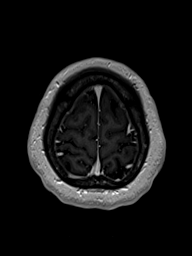
[im 144/144]
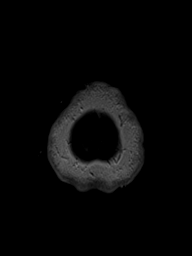

[Series 15: T1 post-contrast · coronal · 4.0mm · 0.72mm/px · 2 of 29 slices shown]
[im 1/29]
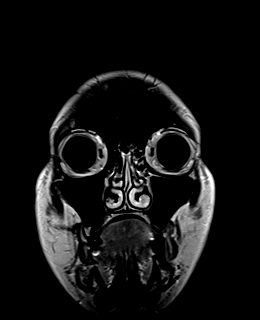
[im 29/29]
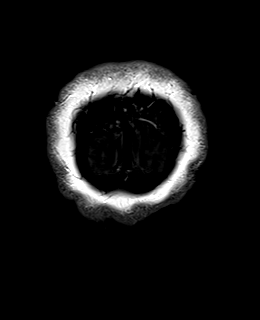

[48 of 48 positions shown; findings below may reference images not displayed]

FINDINGS: Brain: Cerebral volume remains normal. Mild brachycephaly, normal
variant. No restricted diffusion to suggest acute infarction. No
midline shift, mass effect, evidence of mass lesion,
ventriculomegaly, extra-axial collection or acute intracranial
hemorrhage. Cervicomedullary junction and pituitary are within
normal limits. Gray and white matter signal is within normal limits
throughout the brain. No encephalomalacia or chronic cerebral blood
products.

No abnormal enhancement identified.  No dural thickening.

Vascular: Major intracranial vascular flow voids are preserved. The
major dural venous sinuses, including the superior sagittal sinus,
are enhancing and appear to be patent.

Skull and upper cervical spine: Negative visible cervical spine.
Visualized bone marrow signal is within normal limits. The calvarium
at the vertex appears unremarkable.

Sinuses/Orbits: Negative orbits.  Paranasal sinuses are clear.

Other: Mastoids are clear. Visible internal auditory structures
appear normal.

Scalp soft tissues, including those at the vertex, appear normal.
Negative visible face.
IMPRESSION: Normal MRI appearance of the brain. No explanation for head/vertex
pain.

## 2020-11-05 ENCOUNTER — Other Ambulatory Visit (HOSPITAL_COMMUNITY): Payer: Self-pay

## 2020-11-05 MED ORDER — AMLODIPINE BESYLATE 10 MG PO TABS
ORAL_TABLET | ORAL | 3 refills | Status: DC
  Filled 2020-11-05: qty 90, 90d supply, fill #0
  Filled 2021-03-06: qty 90, 90d supply, fill #1
  Filled 2021-08-31: qty 90, 90d supply, fill #2

## 2020-11-05 MED ORDER — AMITRIPTYLINE HCL 50 MG PO TABS
ORAL_TABLET | ORAL | 3 refills | Status: DC
  Filled 2020-11-05: qty 90, 90d supply, fill #0

## 2020-11-06 ENCOUNTER — Other Ambulatory Visit (HOSPITAL_COMMUNITY): Payer: Self-pay

## 2020-11-06 DIAGNOSIS — L219 Seborrheic dermatitis, unspecified: Secondary | ICD-10-CM | POA: Diagnosis not present

## 2020-11-06 DIAGNOSIS — L669 Cicatricial alopecia, unspecified: Secondary | ICD-10-CM | POA: Insufficient documentation

## 2020-11-06 DIAGNOSIS — R208 Other disturbances of skin sensation: Secondary | ICD-10-CM | POA: Diagnosis not present

## 2020-11-06 MED ORDER — CLOBETASOL PROPIONATE 0.05 % EX OINT
TOPICAL_OINTMENT | CUTANEOUS | 3 refills | Status: DC
  Filled 2020-11-06: qty 60, 30d supply, fill #0

## 2020-11-06 MED ORDER — NALTREXONE HCL 50 MG PO TABS
25.0000 mg | ORAL_TABLET | Freq: Every day | ORAL | 2 refills | Status: DC
  Filled 2020-11-06: qty 15, 30d supply, fill #0

## 2020-11-08 DIAGNOSIS — R208 Other disturbances of skin sensation: Secondary | ICD-10-CM | POA: Diagnosis not present

## 2020-11-08 DIAGNOSIS — L669 Cicatricial alopecia, unspecified: Secondary | ICD-10-CM | POA: Diagnosis not present

## 2020-12-01 ENCOUNTER — Ambulatory Visit
Admission: RE | Admit: 2020-12-01 | Discharge: 2020-12-01 | Disposition: A | Discharge status: Home / Self Care | Payer: 59 | Source: Ambulatory Visit | Attending: Internal Medicine | Admitting: Internal Medicine

## 2020-12-01 DIAGNOSIS — Z1231 Encounter for screening mammogram for malignant neoplasm of breast: Secondary | ICD-10-CM | POA: Diagnosis not present

## 2020-12-02 ENCOUNTER — Other Ambulatory Visit: Payer: Self-pay | Admitting: Internal Medicine

## 2020-12-02 DIAGNOSIS — R928 Other abnormal and inconclusive findings on diagnostic imaging of breast: Secondary | ICD-10-CM

## 2020-12-16 ENCOUNTER — Ambulatory Visit: Payer: 59 | Admitting: Podiatry

## 2020-12-16 ENCOUNTER — Other Ambulatory Visit: Payer: Self-pay

## 2020-12-16 ENCOUNTER — Ambulatory Visit: Payer: 59

## 2020-12-16 ENCOUNTER — Encounter: Payer: Self-pay | Admitting: Podiatry

## 2020-12-16 ENCOUNTER — Other Ambulatory Visit (HOSPITAL_COMMUNITY): Payer: Self-pay

## 2020-12-16 DIAGNOSIS — L603 Nail dystrophy: Secondary | ICD-10-CM | POA: Diagnosis not present

## 2020-12-16 DIAGNOSIS — L600 Ingrowing nail: Secondary | ICD-10-CM

## 2020-12-16 DIAGNOSIS — L814 Other melanin hyperpigmentation: Secondary | ICD-10-CM | POA: Diagnosis not present

## 2020-12-16 DIAGNOSIS — B351 Tinea unguium: Secondary | ICD-10-CM | POA: Diagnosis not present

## 2020-12-16 MED ORDER — NEOMYCIN-POLYMYXIN-HC 1 % OT SOLN
OTIC | 1 refills | Status: DC
  Filled 2020-12-16: qty 10, 30d supply, fill #0

## 2020-12-16 NOTE — Patient Instructions (Signed)

## 2020-12-17 NOTE — Progress Notes (Signed)
Subjective:  Patient ID: Kimberly Kerr, female    DOB: 1975-02-25,  MRN: [id number] HPI Chief Complaint  Patient presents with   Toe Pain    Hallux right - medial border, ingrown, tender x 2 weeks, tried neosporin   New Patient (Initial Visit)    Est pt 2018    45 y.o. female presents with the above complaint.   ROS: Denies fever chills nausea vomiting muscle aches pains calf pain back pain chest pain shortness of breath.  No past medical history on file. Past Surgical History:  Procedure Laterality Date   FOOT SURGERY     TUBAL LIGATION      Current Outpatient Medications:    NEOMYCIN-POLYMYXIN-HYDROCORTISONE (CORTISPORIN) 1 % SOLN OTIC solution, Apply 1-2 drops 2 times a day to toe after soaking, Disp: 10 mL, Rfl: 1   ALPRAZolam (XANAX) 0.25 MG tablet, Take 1 tablet by mouth Twice a day if needed, Disp: 4 tablet, Rfl: 0   amitriptyline (ELAVIL) 50 MG tablet, TAKE 1 TABLET BY MOUTH ONCE A DAY, Disp: 90 tablet, Rfl: 0   amLODipine (NORVASC) 10 MG tablet, TAKE 1 TABLET BY MOUTH DAILY, Disp: 90 tablet, Rfl: 3   atovaquone-proguanil (MALARONE) 250-100 MG TABS tablet, Take 1 tablet by mouth daily starting 1 day prior to travel and continuing for 7 days following return to [place], Disp: 13 tablet, Rfl: 0   clobetasol ointment (TEMOVATE) 0.05 %, Apply to scalp 3-4 times per week. Not to face., Disp: 60 g, Rfl: 3   gabapentin (NEURONTIN) 100 MG capsule, gabapentin 100 mg capsule  TAKE 1 CAPSULE BY MOUTH THREE TIMES DAILY, Disp: , Rfl:    naltrexone (DEPADE) 50 MG tablet, Take 1/2 tablet by mouth daily, Disp: 15 tablet, Rfl: 2   scopolamine (TRANSDERM-SCOP) 1 MG/3DAYS, Apply 1 patch to skin behind the ear every 72 hours as needed, Disp: 2 patch, Rfl: 0  No Known Allergies Review of Systems Objective:  There were no vitals filed for this visit.  General: Well developed, nourished, in no acute distress, alert and oriented x3   Dermatological: Skin is warm, dry and supple bilateral. Nails  x 10 are well maintained; remaining integument appears unremarkable at this time. There are no open sores, no preulcerative lesions, no rash or signs of infection present.  Hallux right does demonstrate a sharp incurvated nail margin with mild erythema along the distal medial aspect tenderness on palpation of the nail itself.    Vascular: Dorsalis Pedis artery and Posterior Tibial artery pedal pulses are 2/4 bilateral with immedate capillary fill time. Pedal hair growth present. No varicosities and no lower extremity edema present bilateral.   Neruologic: Grossly intact via light touch bilateral. Vibratory intact via tuning fork bilateral. Protective threshold with Semmes Wienstein monofilament intact to all pedal sites bilateral. Patellar and Achilles deep tendon reflexes 2+ bilateral. No Babinski or clonus noted bilateral.   Musculoskeletal: No gross boney pedal deformities bilateral. No pain, crepitus, or limitation noted with foot and ankle range of motion bilateral. Muscular strength 5/5 in all groups tested bilateral.  Gait: Unassisted, Nonantalgic.    Radiographs:  None taken  Assessment & Plan:   Assessment: Ingrown toenail tibial border hallux right.  Onychomycosis or nail dystrophy.  Plan: Samples of the skin and nail were taken today be sent for pathologic evaluation to affected toes.  Also we performed a chemical matricectomy along the tibial border of the hallux right today she tolerated procedure well after local anesthetic was administered.  She  was given both oral and written home-going structure for the care and soaking of the toe.  She is also provided a prescription for Cortisporin Otic to be applied twice daily after soaking.  Follow-up with her in 2 weeks for the nail check and in 1 month for the sample follow-up.     Rithy Mandley T. [place], [place]

## 2020-12-30 ENCOUNTER — Encounter: Payer: Self-pay | Admitting: Podiatry

## 2020-12-30 ENCOUNTER — Ambulatory Visit: Payer: 59 | Admitting: Podiatry

## 2020-12-30 ENCOUNTER — Other Ambulatory Visit: Payer: Self-pay

## 2020-12-30 DIAGNOSIS — L600 Ingrowing nail: Secondary | ICD-10-CM

## 2020-12-30 DIAGNOSIS — Z9889 Other specified postprocedural states: Secondary | ICD-10-CM

## 2020-12-30 NOTE — Progress Notes (Signed)
She presents today for follow-up of her nail procedure hallux right.  She states that the toenail feels so much better the toe is still just a little bit more sore than she thought it would be however she continues to soak in her Betadine and warm water.  She is questioning as to whether the results are back for her nail pathology yet.  Objective: Vital signs are stable alert oriented x3 nail margin appears to be 85% healed there is no purulence no malodor mild tenderness distally no signs of infection.  We called to check on the results and they are not ready as of yet.  Assessment: Nail dystrophy ruling out onychomycosis to be determined.  Well-healing matrixectomy hallux right tibial border.  Plan: Discussed etiology pathology conservative surgical therapies.  At this point I recommended that she continue to soak Epson salts and warm water I will follow-up with her in 2 weeks

## 2020-12-31 ENCOUNTER — Ambulatory Visit
Admission: RE | Admit: 2020-12-31 | Discharge: 2020-12-31 | Disposition: A | Discharge status: Home / Self Care | Payer: 59 | Source: Ambulatory Visit | Attending: Internal Medicine | Admitting: Internal Medicine

## 2020-12-31 ENCOUNTER — Ambulatory Visit: Payer: 59

## 2020-12-31 DIAGNOSIS — R922 Inconclusive mammogram: Secondary | ICD-10-CM | POA: Diagnosis not present

## 2020-12-31 DIAGNOSIS — R928 Other abnormal and inconclusive findings on diagnostic imaging of breast: Secondary | ICD-10-CM

## 2021-01-20 ENCOUNTER — Other Ambulatory Visit (HOSPITAL_COMMUNITY): Payer: Self-pay

## 2021-01-20 ENCOUNTER — Encounter: Payer: Self-pay | Admitting: Podiatry

## 2021-01-20 ENCOUNTER — Other Ambulatory Visit: Payer: Self-pay

## 2021-01-20 ENCOUNTER — Ambulatory Visit: Payer: 59 | Admitting: Podiatry

## 2021-01-20 DIAGNOSIS — L603 Nail dystrophy: Secondary | ICD-10-CM

## 2021-01-20 DIAGNOSIS — Z79899 Other long term (current) drug therapy: Secondary | ICD-10-CM

## 2021-01-20 MED ORDER — TERBINAFINE HCL 250 MG PO TABS
250.0000 mg | ORAL_TABLET | Freq: Every day | ORAL | 0 refills | Status: DC
  Filled 2021-01-20 – 2021-02-28 (×2): qty 30, 30d supply, fill #0

## 2021-01-21 NOTE — Progress Notes (Signed)
She presents today for follow-up of her matricectomy tibial border hallux right she is concerned about some darkness.  Otherwise she states that it does not hurt it is no problem whatsoever.  She is also here for follow-up of her pathology.  Objective: Vital signs are stable alert and oriented x3 some postinflammatory hyperpigmentation is only thing remaining along the medial border of the hallux right there is no pain on palpation to this area.  Pathology results do demonstrate nail dystrophy as well as dermatophyte infection.  Assessment well-healing matrixectomy, onychomycosis.  Plan: Discussed etiology pathology and surgical therapies at this point we discussed the pros and cons of laser therapy versus oral therapy.  She would like to try oral therapy.  So at this point we are sending her for blood work and we will write her for her first 30 days of Lamisil.  We did discuss pros and cons of this medication the possible side effects and complications associated with this drug she understands that and is amendable to it.  I will follow-up with her in 1 month.  Should her blood work come back abnormal we will notify her immediately and discuss appropriate measures.

## 2021-01-28 ENCOUNTER — Other Ambulatory Visit (HOSPITAL_COMMUNITY): Payer: Self-pay

## 2021-02-19 ENCOUNTER — Other Ambulatory Visit (HOSPITAL_COMMUNITY): Payer: Self-pay

## 2021-02-19 DIAGNOSIS — R208 Other disturbances of skin sensation: Secondary | ICD-10-CM | POA: Diagnosis not present

## 2021-02-19 DIAGNOSIS — L669 Cicatricial alopecia, unspecified: Secondary | ICD-10-CM | POA: Diagnosis not present

## 2021-02-19 DIAGNOSIS — R519 Headache, unspecified: Secondary | ICD-10-CM | POA: Diagnosis not present

## 2021-02-19 MED ORDER — KETOCONAZOLE 2 % EX SHAM
MEDICATED_SHAMPOO | CUTANEOUS | 11 refills | Status: DC
  Filled 2021-02-19 – 2021-02-28 (×2): qty 120, 30d supply, fill #0

## 2021-02-19 MED ORDER — DOXYCYCLINE HYCLATE 100 MG PO CAPS
ORAL_CAPSULE | ORAL | 1 refills | Status: DC
  Filled 2021-02-19 – 2021-02-28 (×2): qty 90, 90d supply, fill #0

## 2021-02-26 ENCOUNTER — Ambulatory Visit: Payer: 59 | Admitting: Podiatry

## 2021-02-27 ENCOUNTER — Other Ambulatory Visit (HOSPITAL_COMMUNITY): Payer: Self-pay

## 2021-02-28 ENCOUNTER — Other Ambulatory Visit (HOSPITAL_COMMUNITY): Payer: Self-pay

## 2021-03-06 ENCOUNTER — Other Ambulatory Visit (HOSPITAL_COMMUNITY): Payer: Self-pay

## 2021-03-26 ENCOUNTER — Other Ambulatory Visit (HOSPITAL_COMMUNITY): Payer: Self-pay

## 2021-03-31 ENCOUNTER — Other Ambulatory Visit (HOSPITAL_COMMUNITY): Payer: Self-pay

## 2021-03-31 DIAGNOSIS — N898 Other specified noninflammatory disorders of vagina: Secondary | ICD-10-CM | POA: Diagnosis not present

## 2021-03-31 DIAGNOSIS — B009 Herpesviral infection, unspecified: Secondary | ICD-10-CM | POA: Diagnosis not present

## 2021-03-31 DIAGNOSIS — B3731 Acute candidiasis of vulva and vagina: Secondary | ICD-10-CM | POA: Diagnosis not present

## 2021-03-31 DIAGNOSIS — R350 Frequency of micturition: Secondary | ICD-10-CM | POA: Diagnosis not present

## 2021-03-31 MED ORDER — VALACYCLOVIR HCL 500 MG PO TABS
ORAL_TABLET | ORAL | 5 refills | Status: AC
  Filled 2021-03-31: qty 6, 3d supply, fill #0

## 2021-03-31 MED ORDER — FLUCONAZOLE 150 MG PO TABS
ORAL_TABLET | ORAL | 0 refills | Status: DC
  Filled 2021-03-31: qty 1, 1d supply, fill #0

## 2021-05-12 DIAGNOSIS — Z113 Encounter for screening for infections with a predominantly sexual mode of transmission: Secondary | ICD-10-CM | POA: Diagnosis not present

## 2021-05-12 DIAGNOSIS — Z803 Family history of malignant neoplasm of breast: Secondary | ICD-10-CM | POA: Diagnosis not present

## 2021-05-12 DIAGNOSIS — Z800 Family history of malignant neoplasm of digestive organs: Secondary | ICD-10-CM | POA: Diagnosis not present

## 2021-05-12 DIAGNOSIS — Z01419 Encounter for gynecological examination (general) (routine) without abnormal findings: Secondary | ICD-10-CM | POA: Diagnosis not present

## 2021-05-12 DIAGNOSIS — Z3009 Encounter for other general counseling and advice on contraception: Secondary | ICD-10-CM | POA: Diagnosis not present

## 2021-08-31 ENCOUNTER — Other Ambulatory Visit (HOSPITAL_COMMUNITY): Payer: Self-pay

## 2021-08-31 MED ORDER — AMITRIPTYLINE HCL 50 MG PO TABS
ORAL_TABLET | ORAL | 3 refills | Status: DC
  Filled 2021-08-31: qty 90, 90d supply, fill #0

## 2021-08-31 MED ORDER — AMITRIPTYLINE HCL 50 MG PO TABS
ORAL_TABLET | ORAL | 2 refills | Status: DC
  Filled 2021-08-31: qty 90, 90d supply, fill #0
  Filled 2022-01-27: qty 90, 90d supply, fill #1
  Filled 2022-05-21: qty 90, 90d supply, fill #2

## 2021-11-12 ENCOUNTER — Telehealth: Payer: 59 | Admitting: Physician Assistant

## 2021-11-12 ENCOUNTER — Other Ambulatory Visit (HOSPITAL_COMMUNITY): Payer: Self-pay

## 2021-11-12 DIAGNOSIS — R000 Tachycardia, unspecified: Secondary | ICD-10-CM | POA: Diagnosis not present

## 2021-11-12 DIAGNOSIS — R300 Dysuria: Secondary | ICD-10-CM | POA: Diagnosis not present

## 2021-11-12 MED ORDER — NITROFURANTOIN MONOHYD MACRO 100 MG PO CAPS
ORAL_CAPSULE | ORAL | 0 refills | Status: DC
  Filled 2021-11-12: qty 10, 5d supply, fill #0

## 2021-11-12 NOTE — Progress Notes (Signed)
The patient no-showed for appointment despite this provider sending direct link x 2 with no response and waiting for at least 10 minutes from appointment time for patient to join. They will be marked as a NS for this appointment/time.   Brittain Hosie M Jaysean Manville, PA-C    

## 2021-11-13 ENCOUNTER — Other Ambulatory Visit (HOSPITAL_COMMUNITY): Payer: Self-pay

## 2022-01-27 ENCOUNTER — Other Ambulatory Visit (HOSPITAL_COMMUNITY): Payer: Self-pay

## 2022-01-28 ENCOUNTER — Other Ambulatory Visit (HOSPITAL_COMMUNITY): Payer: Self-pay

## 2022-01-28 MED ORDER — AMLODIPINE BESYLATE 10 MG PO TABS
10.0000 mg | ORAL_TABLET | Freq: Every day | ORAL | 3 refills | Status: DC
  Filled 2022-01-28: qty 90, 90d supply, fill #0
  Filled 2022-05-21: qty 90, 90d supply, fill #1
  Filled 2022-10-19: qty 90, 90d supply, fill #2

## 2022-03-12 ENCOUNTER — Other Ambulatory Visit: Payer: Self-pay | Admitting: Nurse Practitioner

## 2022-03-12 DIAGNOSIS — Z1231 Encounter for screening mammogram for malignant neoplasm of breast: Secondary | ICD-10-CM

## 2022-05-18 ENCOUNTER — Other Ambulatory Visit (HOSPITAL_COMMUNITY)
Admission: RE | Admit: 2022-05-18 | Discharge: 2022-05-18 | Disposition: A | Discharge status: Home / Self Care | Payer: Commercial Managed Care - PPO | Source: Ambulatory Visit | Attending: Nurse Practitioner | Admitting: Nurse Practitioner

## 2022-05-18 ENCOUNTER — Other Ambulatory Visit: Payer: Self-pay | Admitting: Nurse Practitioner

## 2022-05-18 DIAGNOSIS — Z1211 Encounter for screening for malignant neoplasm of colon: Secondary | ICD-10-CM | POA: Diagnosis not present

## 2022-05-18 DIAGNOSIS — Z124 Encounter for screening for malignant neoplasm of cervix: Secondary | ICD-10-CM | POA: Diagnosis not present

## 2022-05-18 DIAGNOSIS — Z113 Encounter for screening for infections with a predominantly sexual mode of transmission: Secondary | ICD-10-CM | POA: Diagnosis not present

## 2022-05-18 DIAGNOSIS — Z800 Family history of malignant neoplasm of digestive organs: Secondary | ICD-10-CM | POA: Diagnosis not present

## 2022-05-18 DIAGNOSIS — Z803 Family history of malignant neoplasm of breast: Secondary | ICD-10-CM | POA: Diagnosis not present

## 2022-05-18 DIAGNOSIS — Z01419 Encounter for gynecological examination (general) (routine) without abnormal findings: Secondary | ICD-10-CM | POA: Diagnosis not present

## 2022-05-19 ENCOUNTER — Ambulatory Visit
Admission: RE | Admit: 2022-05-19 | Discharge: 2022-05-19 | Disposition: A | Discharge status: Home / Self Care | Payer: Commercial Managed Care - PPO | Source: Ambulatory Visit | Attending: Family Medicine | Admitting: Family Medicine

## 2022-05-19 ENCOUNTER — Other Ambulatory Visit (HOSPITAL_COMMUNITY): Payer: Self-pay

## 2022-05-19 ENCOUNTER — Ambulatory Visit (INDEPENDENT_AMBULATORY_CARE_PROVIDER_SITE_OTHER): Payer: Commercial Managed Care - PPO

## 2022-05-19 VITALS — BP 131/86 | HR 105 | Temp 97.9°F | Resp 16

## 2022-05-19 DIAGNOSIS — S92501A Displaced unspecified fracture of right lesser toe(s), initial encounter for closed fracture: Secondary | ICD-10-CM | POA: Diagnosis not present

## 2022-05-19 DIAGNOSIS — S92514A Nondisplaced fracture of proximal phalanx of right lesser toe(s), initial encounter for closed fracture: Secondary | ICD-10-CM | POA: Diagnosis not present

## 2022-05-19 DIAGNOSIS — M79671 Pain in right foot: Secondary | ICD-10-CM

## 2022-05-19 LAB — CYTOLOGY - PAP
Comment: NEGATIVE
Diagnosis: NEGATIVE
High risk HPV: NEGATIVE

## 2022-05-19 MED ORDER — IBUPROFEN 800 MG PO TABS
800.0000 mg | ORAL_TABLET | Freq: Three times a day (TID) | ORAL | 0 refills | Status: AC
  Filled 2022-05-19: qty 21, 7d supply, fill #0

## 2022-05-19 NOTE — ED Triage Notes (Signed)
Hit pinky toe of right foot on ottoman on Saturday. Pain and swelling present in right foot.

## 2022-05-19 NOTE — ED Provider Notes (Signed)
[facility]    CSN: [id number] Arrival date & time: 05/19/22  0800      History   Chief Complaint Chief Complaint  Patient presents with   Toe Injury    Hit little toe on the corner of the [place] my whole foot swollen hurts so bad this happened on Saturday - Entered by patient    HPI Kimberly Kerr is a 47 y.o. female.   HPI  Patient stubbed her toe against a corner of wooden furniture has pain symptoms And swelling.  It has been 4 days.  Not improving.  History reviewed. No pertinent past medical history.  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      Home Medications    Prior to Admission medications   Medication Sig Start Date End Date Taking? Authorizing Provider  ibuprofen (ADVIL) 800 MG tablet Take 1 tablet (800 mg total) by mouth 3 (three) times daily. 05/19/22  Yes Eustace Moore, MD  ALPRAZolam Prudy Feeler) 0.25 MG tablet Take 1 tablet by mouth Twice a day if needed 07/16/20     amitriptyline (ELAVIL) 50 MG tablet TAKE 1 TABLET BY MOUTH ONCE A DAY 03/27/20 03/27/21  Kirby Funk, MD  amitriptyline (ELAVIL) 50 MG tablet TAKE 1 TABLET BY MOUTH ONCE DAILY 08/31/21     amLODipine (NORVASC) 10 MG tablet Take 1 tablet (10 mg total) by mouth daily. 01/28/22     clobetasol ointment (TEMOVATE) 0.05 % Apply to scalp 3-4 times per week. Not to face. 11/06/20     gabapentin (NEURONTIN) 100 MG capsule gabapentin 100 mg capsule  TAKE 1 CAPSULE BY MOUTH THREE TIMES DAILY    [provider]  ketoconazole (NIZORAL) 2 % shampoo Apply to scalp and shampoo as usual. Repeat 1-2 times weekly 02/19/21      valACYclovir (VALTREX) 500 MG tablet Take 1 tablet by mouth twice a day for 3 days starting at first sign of outbreak 03/31/21       Family History Family History  Problem Relation Age of Onset   Breast cancer Neg Hx     Social History Social History   Tobacco Use   Smoking status: Never   Smokeless tobacco: Never  Substance Use Topics   Alcohol use: Yes   Drug use: No     Allergies   Patient has no known allergies.   Review of Systems Review of Systems See HPI  Physical Exam Triage Vital Signs ED Triage Vitals [05/19/22 0810]  Enc Vitals Group     BP 131/86     Pulse Rate (!) 105     Resp 16     Temp 97.9 F (36.6 C)     Temp Source Oral     SpO2 98 %     Weight      Height      Head Circumference      Peak Flow      Pain Score 8     Pain Loc      Pain Edu?      Excl. in GC?  No data found.  Updated Vital Signs BP 131/86 (BP Location: Left Arm)   Pulse (!) 105   Temp 97.9 F (36.6 C) (Oral)   Resp 16   LMP 05/18/2022 Comment: reports no chance of pregnancy  SpO2 98%       Physical Exam Constitutional:      General: She is not in acute distress.    Appearance: She is well-developed.  HENT:     Head: Normocephalic and atraumatic.  Eyes:     Conjunctiva/sclera: Conjunctivae normal.     Pupils: Pupils are equal, round, and reactive to light.  Cardiovascular:     Rate and Rhythm: Normal rate.  Pulmonary:     Effort: Pulmonary effort is normal. No respiratory distress.  Abdominal:     General: There is no distension.     Palpations: Abdomen is soft.  Musculoskeletal:        General: Normal range of motion.     Cervical back: Normal range of motion.       Feet:  Skin:    General: Skin is warm and dry.  Neurological:     Mental Status: She is alert.     Gait: Gait abnormal.      UC Treatments / Results  Labs (all labs ordered are listed, but only abnormal results are displayed) Labs Reviewed - No data to  display  EKG   Radiology DG Foot Complete Right  Result Date: 05/19/2022 CLINICAL DATA:  Trauma 4 days ago with lateral metatarsal region foot pain in fifth toe pain. EXAM: RIGHT FOOT COMPLETE - 3+ VIEW COMPARISON:  None Available. 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 Previous osteotomy of the proximal phalanx. Nondisplaced fracture of the midportion of the proximal phalanx of the fifth toe. No extension to the articular surface. IMPRESSION: 1. Nondisplaced fracture of the midportion of the proximal phalanx of the fifth toe. 2. 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 Electronically Signed   By: Paulina Fusi M.D.   On: 05/19/2022 08:26    Procedures Procedures (including critical care time)  Medications Ordered in UC Medications - No data to display  Initial Impression / Assessment and Plan / UC Course  I have reviewed the triage vital signs and the nursing notes.  Pertinent labs & imaging results that were available during my care of the patient were reviewed by me and considered in my medical decision making (see chart for details).     Discussed toe fracture.  Management.  Healing.  Reasons for referral Final Clinical Impressions(s) / UC Diagnoses   Final diagnoses:  Closed fracture of phalanx of right fifth toe, initial encounter     Discharge Instructions      Use ice and elevation to reduce pain and swelling Take ibuprofen 3 times a day with food, for pain Fracture shoe until you can walk comfortably without it Buddy taping will help reduce the pain Follow-up with your primary care doctor or sports medicine if you fail to improve   ED Prescriptions     Medication Sig Dispense Auth. Provider   ibuprofen (ADVIL) 800 MG tablet Take 1 tablet (800 mg total) by mouth 3 (three) times daily. 21 tablet Eustace Moore, MD      PDMP not reviewed this encounter.    Eustace Moore, MD 05/19/22 [phone]

## 2022-05-19 NOTE — Discharge Instructions (Signed)
Use ice and elevation to reduce pain and swelling Take ibuprofen 3 times a day with food, for pain Fracture shoe until you can walk comfortably without it Buddy taping will help reduce the pain Follow-up with your primary care doctor or sports medicine if you fail to improve

## 2022-05-21 ENCOUNTER — Other Ambulatory Visit (HOSPITAL_COMMUNITY): Payer: Self-pay

## 2022-10-19 ENCOUNTER — Other Ambulatory Visit (HOSPITAL_COMMUNITY): Payer: Self-pay

## 2022-10-19 MED ORDER — AMITRIPTYLINE HCL 50 MG PO TABS
50.0000 mg | ORAL_TABLET | Freq: Every day | ORAL | 2 refills | Status: AC
  Filled 2022-10-19: qty 90, 90d supply, fill #0
  Filled 2023-02-03: qty 90, 90d supply, fill #1
  Filled 2023-08-05: qty 90, 90d supply, fill #2

## 2022-10-20 ENCOUNTER — Other Ambulatory Visit (HOSPITAL_COMMUNITY): Payer: Self-pay

## 2022-11-09 ENCOUNTER — Other Ambulatory Visit: Payer: Self-pay | Admitting: Medical Genetics

## 2022-11-09 DIAGNOSIS — Z006 Encounter for examination for normal comparison and control in clinical research program: Secondary | ICD-10-CM

## 2022-11-30 ENCOUNTER — Encounter: Payer: Self-pay | Admitting: Nurse Practitioner

## 2022-12-03 ENCOUNTER — Ambulatory Visit
Admission: RE | Admit: 2022-12-03 | Discharge: 2022-12-03 | Disposition: A | Discharge status: Home / Self Care | Payer: Commercial Managed Care - PPO | Source: Ambulatory Visit | Attending: Nurse Practitioner | Admitting: Nurse Practitioner

## 2022-12-03 DIAGNOSIS — Z1231 Encounter for screening mammogram for malignant neoplasm of breast: Secondary | ICD-10-CM | POA: Diagnosis not present

## 2023-02-03 ENCOUNTER — Other Ambulatory Visit (HOSPITAL_COMMUNITY): Payer: Self-pay

## 2023-02-03 ENCOUNTER — Other Ambulatory Visit (HOSPITAL_COMMUNITY): Payer: Self-pay | Admitting: Family Medicine

## 2023-02-03 ENCOUNTER — Other Ambulatory Visit: Payer: Self-pay

## 2023-02-04 ENCOUNTER — Other Ambulatory Visit (HOSPITAL_COMMUNITY): Payer: Self-pay

## 2023-02-04 MED ORDER — AMLODIPINE BESYLATE 10 MG PO TABS
10.0000 mg | ORAL_TABLET | Freq: Every day | ORAL | 3 refills | Status: AC
  Filled 2023-02-04: qty 90, 90d supply, fill #0
  Filled 2023-08-05: qty 90, 90d supply, fill #1
  Filled 2023-11-22 – 2023-12-07 (×2): qty 90, 90d supply, fill #2

## 2023-02-04 MED ORDER — VALACYCLOVIR HCL 500 MG PO TABS
500.0000 mg | ORAL_TABLET | Freq: Two times a day (BID) | ORAL | 5 refills | Status: AC
  Filled 2023-02-04: qty 6, 3d supply, fill #0
  Filled 2023-08-31 (×2): qty 6, 3d supply, fill #1

## 2023-02-08 ENCOUNTER — Encounter (HOSPITAL_COMMUNITY): Payer: Self-pay

## 2023-02-08 ENCOUNTER — Other Ambulatory Visit (HOSPITAL_COMMUNITY): Payer: Self-pay

## 2023-02-10 ENCOUNTER — Other Ambulatory Visit (HOSPITAL_COMMUNITY): Payer: Self-pay

## 2023-02-10 MED ORDER — CLOBETASOL PROPIONATE 0.05 % EX OINT
TOPICAL_OINTMENT | CUTANEOUS | 1 refills | Status: DC
  Filled 2023-02-10: qty 60, 30d supply, fill #0
  Filled 2023-08-05: qty 60, 30d supply, fill #1

## 2023-02-11 ENCOUNTER — Other Ambulatory Visit (HOSPITAL_COMMUNITY): Payer: Self-pay

## 2023-08-05 ENCOUNTER — Other Ambulatory Visit (HOSPITAL_COMMUNITY): Payer: Self-pay

## 2023-08-05 ENCOUNTER — Other Ambulatory Visit: Payer: Self-pay

## 2023-08-06 ENCOUNTER — Other Ambulatory Visit (HOSPITAL_COMMUNITY): Payer: Self-pay

## 2023-08-10 ENCOUNTER — Other Ambulatory Visit (HOSPITAL_COMMUNITY): Payer: Self-pay

## 2023-08-10 DIAGNOSIS — L280 Lichen simplex chronicus: Secondary | ICD-10-CM | POA: Diagnosis not present

## 2023-08-10 DIAGNOSIS — L219 Seborrheic dermatitis, unspecified: Secondary | ICD-10-CM | POA: Diagnosis not present

## 2023-08-10 DIAGNOSIS — R208 Other disturbances of skin sensation: Secondary | ICD-10-CM | POA: Diagnosis not present

## 2023-08-10 MED ORDER — CLOBETASOL PROPIONATE 0.05 % EX OINT
TOPICAL_OINTMENT | Freq: Two times a day (BID) | CUTANEOUS | 3 refills | Status: AC
  Filled 2023-08-10 – 2023-12-07 (×3): qty 60, 30d supply, fill #0

## 2023-08-11 ENCOUNTER — Other Ambulatory Visit (HOSPITAL_COMMUNITY): Payer: Self-pay

## 2023-08-31 ENCOUNTER — Other Ambulatory Visit (HOSPITAL_COMMUNITY): Payer: Self-pay

## 2023-09-12 ENCOUNTER — Other Ambulatory Visit (HOSPITAL_COMMUNITY): Payer: Self-pay

## 2023-10-21 ENCOUNTER — Other Ambulatory Visit (HOSPITAL_COMMUNITY): Payer: Self-pay

## 2023-10-25 ENCOUNTER — Other Ambulatory Visit (HOSPITAL_COMMUNITY): Payer: Self-pay

## 2023-10-25 ENCOUNTER — Encounter (HOSPITAL_COMMUNITY): Payer: Self-pay

## 2023-11-08 ENCOUNTER — Other Ambulatory Visit: Payer: Self-pay | Admitting: Medical Genetics

## 2023-11-08 DIAGNOSIS — Z006 Encounter for examination for normal comparison and control in clinical research program: Secondary | ICD-10-CM

## 2023-11-22 ENCOUNTER — Other Ambulatory Visit: Payer: Self-pay

## 2023-11-22 ENCOUNTER — Other Ambulatory Visit (HOSPITAL_COMMUNITY): Payer: Self-pay

## 2023-11-22 ENCOUNTER — Encounter (HOSPITAL_COMMUNITY): Payer: Self-pay

## 2023-11-24 ENCOUNTER — Other Ambulatory Visit: Payer: Self-pay

## 2023-11-24 ENCOUNTER — Other Ambulatory Visit (HOSPITAL_COMMUNITY): Payer: Self-pay

## 2023-11-24 MED ORDER — KETOCONAZOLE 2 % EX SHAM
MEDICATED_SHAMPOO | CUTANEOUS | 11 refills | Status: AC
  Filled 2023-11-24: qty 120, 30d supply, fill #0

## 2023-11-25 ENCOUNTER — Other Ambulatory Visit: Payer: Self-pay

## 2023-12-04 ENCOUNTER — Other Ambulatory Visit (HOSPITAL_COMMUNITY): Payer: Self-pay

## 2023-12-05 ENCOUNTER — Other Ambulatory Visit: Payer: Self-pay | Admitting: Nurse Practitioner

## 2023-12-05 DIAGNOSIS — Z1231 Encounter for screening mammogram for malignant neoplasm of breast: Secondary | ICD-10-CM

## 2023-12-06 ENCOUNTER — Other Ambulatory Visit (HOSPITAL_COMMUNITY): Payer: Self-pay

## 2023-12-06 DIAGNOSIS — Z800 Family history of malignant neoplasm of digestive organs: Secondary | ICD-10-CM | POA: Diagnosis not present

## 2023-12-06 DIAGNOSIS — A6000 Herpesviral infection of urogenital system, unspecified: Secondary | ICD-10-CM | POA: Diagnosis not present

## 2023-12-06 DIAGNOSIS — N644 Mastodynia: Secondary | ICD-10-CM | POA: Diagnosis not present

## 2023-12-06 MED ORDER — VALACYCLOVIR HCL 500 MG PO TABS
ORAL_TABLET | ORAL | 5 refills | Status: AC
  Filled 2023-12-06: qty 6, 3d supply, fill #0

## 2023-12-07 ENCOUNTER — Other Ambulatory Visit (HOSPITAL_COMMUNITY): Payer: Self-pay

## 2023-12-07 ENCOUNTER — Other Ambulatory Visit: Payer: Self-pay | Admitting: Nurse Practitioner

## 2023-12-07 DIAGNOSIS — N644 Mastodynia: Secondary | ICD-10-CM

## 2023-12-16 ENCOUNTER — Other Ambulatory Visit (HOSPITAL_COMMUNITY): Payer: Self-pay

## 2024-01-05 ENCOUNTER — Other Ambulatory Visit: Payer: Self-pay | Admitting: Nurse Practitioner

## 2024-01-05 DIAGNOSIS — N644 Mastodynia: Secondary | ICD-10-CM

## 2024-01-13 ENCOUNTER — Other Ambulatory Visit: Payer: Self-pay | Admitting: Nurse Practitioner

## 2024-01-13 ENCOUNTER — Ambulatory Visit
Admission: RE | Admit: 2024-01-13 | Discharge: 2024-01-13 | Disposition: A | Discharge status: Home / Self Care | Source: Ambulatory Visit | Attending: Nurse Practitioner | Admitting: Nurse Practitioner

## 2024-01-13 DIAGNOSIS — N644 Mastodynia: Secondary | ICD-10-CM

## 2024-01-13 DIAGNOSIS — R928 Other abnormal and inconclusive findings on diagnostic imaging of breast: Secondary | ICD-10-CM | POA: Diagnosis not present

## 2024-01-17 ENCOUNTER — Other Ambulatory Visit (HOSPITAL_COMMUNITY): Payer: Self-pay

## 2024-01-17 ENCOUNTER — Other Ambulatory Visit: Payer: Self-pay

## 2024-01-17 MED ORDER — BISACODYL 5 MG PO TBEC
DELAYED_RELEASE_TABLET | ORAL | 0 refills | Status: AC
  Filled 2024-01-17: qty 4, 1d supply, fill #0

## 2024-01-17 MED ORDER — SUTAB 1479-225-188 MG PO TABS
ORAL_TABLET | ORAL | 0 refills | Status: AC
  Filled 2024-01-17: qty 24, 1d supply, fill #0
